# Patient Record
Sex: Female | Born: 1990 | Hispanic: Yes | Marital: Married | Smoking: Never smoker
Health system: Southern US, Community
[De-identification: ages and names within clinical notes are randomized; demographics above are authoritative.]

## PROBLEM LIST (undated history)

## (undated) DIAGNOSIS — Z789 Other specified health status: Secondary | ICD-10-CM

## (undated) HISTORY — PX: TUBAL LIGATION: SHX77

---
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
## 2010-11-26 ENCOUNTER — Ambulatory Visit: Payer: Self-pay | Admitting: Family Medicine

## 2014-06-11 ENCOUNTER — Emergency Department: Payer: Self-pay | Admitting: Emergency Medicine

## 2014-06-19 ENCOUNTER — Emergency Department: Payer: Self-pay | Admitting: Emergency Medicine

## 2015-11-07 ENCOUNTER — Other Ambulatory Visit: Payer: Self-pay | Admitting: Family Medicine

## 2015-11-07 DIAGNOSIS — Z369 Encounter for antenatal screening, unspecified: Secondary | ICD-10-CM

## 2015-11-08 LAB — OB RESULTS CONSOLE HIV ANTIBODY (ROUTINE TESTING): HIV: NONREACTIVE

## 2015-11-08 LAB — OB RESULTS CONSOLE ABO/RH: RH Type: POSITIVE

## 2015-11-08 LAB — OB RESULTS CONSOLE HEPATITIS B SURFACE ANTIGEN: HEP B S AG: NEGATIVE

## 2015-11-08 LAB — OB RESULTS CONSOLE RPR: RPR: NONREACTIVE

## 2015-11-08 LAB — OB RESULTS CONSOLE ANTIBODY SCREEN: ANTIBODY SCREEN: NEGATIVE

## 2015-11-29 ENCOUNTER — Ambulatory Visit
Admission: RE | Admit: 2015-11-29 | Discharge: 2015-11-29 | Disposition: A | Discharge status: Home / Self Care | Payer: Self-pay | Source: Ambulatory Visit | Attending: Maternal & Fetal Medicine | Admitting: Maternal & Fetal Medicine

## 2015-11-29 ENCOUNTER — Ambulatory Visit (HOSPITAL_BASED_OUTPATIENT_CLINIC_OR_DEPARTMENT_OTHER)
Admission: RE | Admit: 2015-11-29 | Discharge: 2015-11-29 | Disposition: A | Discharge status: Home / Self Care | Payer: Medicaid Other | Source: Ambulatory Visit | Attending: Maternal & Fetal Medicine | Admitting: Maternal & Fetal Medicine

## 2015-11-29 VITALS — BP 135/74 | HR 85 | Temp 98.3°F | Resp 18 | Ht 62.4 in | Wt 174.2 lb

## 2015-11-29 DIAGNOSIS — Z36 Encounter for antenatal screening of mother: Secondary | ICD-10-CM | POA: Diagnosis not present

## 2015-11-29 DIAGNOSIS — Z3A11 11 weeks gestation of pregnancy: Secondary | ICD-10-CM | POA: Insufficient documentation

## 2015-11-29 DIAGNOSIS — N83291 Other ovarian cyst, right side: Secondary | ICD-10-CM | POA: Insufficient documentation

## 2015-11-29 DIAGNOSIS — O321XX0 Maternal care for breech presentation, not applicable or unspecified: Secondary | ICD-10-CM | POA: Insufficient documentation

## 2015-11-29 DIAGNOSIS — N83292 Other ovarian cyst, left side: Secondary | ICD-10-CM | POA: Insufficient documentation

## 2015-11-29 DIAGNOSIS — Z369 Encounter for antenatal screening, unspecified: Secondary | ICD-10-CM | POA: Insufficient documentation

## 2015-11-29 DIAGNOSIS — Z3482 Encounter for supervision of other normal pregnancy, second trimester: Secondary | ICD-10-CM

## 2015-11-29 DIAGNOSIS — Z3481 Encounter for supervision of other normal pregnancy, first trimester: Secondary | ICD-10-CM | POA: Insufficient documentation

## 2015-11-29 HISTORY — DX: Other specified health status: Z78.9

## 2015-11-29 NOTE — Progress Notes (Signed)
 Referring physician:  [facility] Length of Consultation: 40 minutes   [name]  was referred to [facility] for genetic counseling to review prenatal screening and testing options.  This note summarizes the information we discussed.    We offered the following routine screening tests for this pregnancy:  First trimester screening, which includes nuchal translucency ultrasound screen and first trimester maternal serum marker screening.  The nuchal translucency has approximately an 80% detection rate for Down syndrome and can be positive for other chromosome abnormalities as well as congenital heart defects.  When combined with a maternal serum marker screening, the detection rate is up to 90% for Down syndrome and up to 97% for trisomy 18.     Maternal serum marker screening, a blood test that measures pregnancy proteins, can provide risk assessments for Down syndrome, trisomy 18, and open neural tube defects (spina bifida, anencephaly). Because it does not directly examine the fetus, it cannot positively diagnose or rule out these problems.  Targeted ultrasound uses high frequency sound waves to create an image of the developing fetus.  An ultrasound is often recommended as a routine means of evaluating the pregnancy.  It is also used to screen for fetal anatomy problems (for example, a heart defect) that might be suggestive of a chromosomal or other abnormality.   Should these screening tests indicate an increased concern, then the following additional testing options would be offered:  The chorionic villus sampling procedure is available for first trimester chromosome analysis.  This involves the withdrawal of a small amount of chorionic villi (tissue from the developing placenta).  Risk of pregnancy loss is estimated to be approximately 1 in 200 to 1 in 100 (0.5 to 1%).  There is approximately a 1% (1 in 100) chance that the CVS chromosome results will  be unclear.  Chorionic villi cannot be tested for neural tube defects.     Amniocentesis involves the removal of a small amount of amniotic fluid from the sac surrounding the fetus with the use of a thin needle inserted through the maternal abdomen and uterus.  Ultrasound guidance is used throughout the procedure.  Fetal cells from amniotic fluid are directly evaluated and > 99.5% of chromosome problems and > 98% of open neural tube defects can be detected. This procedure is generally performed after the 15th week of pregnancy.  The main risks to this procedure include complications leading to miscarriage in less than 1 in 200 cases (0.5%).  As another option for information if the pregnancy is suspected to be an an increased chance for certain chromosome conditions, we also reviewed the availability of cell free fetal DNA testing from maternal blood to determine whether or not the baby may have either Down syndrome, trisomy 13, or trisomy 18.  This test utilizes a maternal blood sample and DNA sequencing technology to isolate circulating cell free fetal DNA from maternal plasma.  The fetal DNA can then be analyzed for DNA sequences that are derived from the three most common chromosomes involved in aneuploidy, chromosomes 13, 18, and 21.  If the overall amount of DNA is greater than the expected level for any of these chromosomes, aneuploidy is suspected.  While we do not consider it a replacement for invasive testing and karyotype analysis, a negative result from this testing would be reassuring, though not a guarantee of a normal chromosome complement for the baby.  An abnormal result is certainly suggestive of an abnormal chromosome complement,   though we would still recommend CVS or amniocentesis to confirm any findings from this testing.   Cystic Fibrosis and Spinal Muscular Atrophy (SMA) screening were also discussed with the patient. Both conditions are recessive, which means that both parents must be  carriers in order to have a child with the disease.  Cystic fibrosis (CF) is one of the most common genetic conditions in persons of Caucasian ancestry.  This condition occurs in approximately 1 in 2,500 Caucasian persons and results in thickened secretions in the lungs, digestive, and reproductive systems.  For a baby to be at risk for having CF, both of the parents must be carriers for this condition.  Approximately 1 in 7 Caucasian persons is a carrier for CF.  Current carrier testing looks for the most common mutations in the gene for CF and can detect approximately 90% of carriers in the Caucasian population.  This means that the carrier screening can greatly reduce, but cannot eliminate, the chance for an individual to have a child with CF.  If an individual is found to be a carrier for CF, then carrier testing would be available for the partner. As part of [place] newborn screening profile, all babies born in the state of [place] will have a two-tier screening process.  Specimens are first tested to determine the concentration of immunoreactive trypsinogen (IRT).  The top 5% of specimens with the highest IRT values then undergo DNA testing using a panel of over 40 common CF mutations. SMA is a neurodegenerative disorder that leads to atrophy of skeletal muscle and overall weakness.  This condition is also more prevalent in the Caucasian population, with 1 in 40-1 in 60 persons being a carrier and 1 in 6,000-1 in 10,000 children being affected.  There are multiple forms of the disease, with some causing death in infancy to other forms with survival into adulthood.  The genetics of SMA is complex, but carrier screening can detect up to 95% of carriers in the Caucasian population.  Similar to CF, a negative result can greatly reduce, but cannot eliminate, the chance to have a child with SMA.  We obtained a detailed family history and pregnancy history.  Alexandra Thompson and her partner reported that  his mother, maternal aunt and maternal grandmother have all had breast cancer.  The grandmother was in her 101s at the time of diagnosis, but the other two were 90-42 years old.  He stated that family met with someone and had testing that was "negative". If they had genetic testing for cancer genetic syndromes, we are happy to review those medical records to help answer any questions he may.  He also reported one paternal first cousin once removed who passed away at birth, but the cause is not known.  There may be many reasons for infant death including some which are genetic and others that are not.  Without additional medical information, we cannot assess the chance for a similar condition in other family members.  We are happy to discuss this further if more information becomes available. The remainder of the family history was reported to be unremarkable for birth defects, mental retardation, recurrent pregnancy loss or known chromosome abnormalities.  Alexandra Thompson stated that this is her fifth pregnancy.  She has two healthy children and one miscarriage with her first partner and had one early miscarriage with her current partner.  He has two healthy children as well.  She reported no complications or exposures that would be expected to  increase the risk for birth defects.  Alexandra Thompson had one episode of drinking alcohol prior to learning that she was pregnant.  This was estimated to have been at 4 weeks, during the all or none period, and would not be expected to be concerning.  After consideration of the options, Alexandra Thompson elected to proceed with an ultrasound only and to decline first trimester screening as well as CF and SMA carrier testing.  An ultrasound was performed at the time of the visit.  The gestational age was consistent with  11 weeks.  Fetal anatomy could not be assessed due to early gestational age.  Please refer to the ultrasound report for details of that study.  Alexandra Thompson was  encouraged to call with questions or concerns.  We can be contacted at [phone].    Deborah F. Wells, MS, CGC   

## 2015-12-03 NOTE — Progress Notes (Signed)
Agree with assessment and plan as outlined in CGC Wells's note.   

## 2016-01-06 ENCOUNTER — Emergency Department
Admission: EM | Admit: 2016-01-06 | Discharge: 2016-01-06 | Disposition: A | Discharge status: Home / Self Care | Payer: Medicaid Other | Attending: Emergency Medicine | Admitting: Emergency Medicine

## 2016-01-06 ENCOUNTER — Encounter: Payer: Self-pay | Admitting: Emergency Medicine

## 2016-01-06 DIAGNOSIS — Z3A16 16 weeks gestation of pregnancy: Secondary | ICD-10-CM | POA: Insufficient documentation

## 2016-01-06 DIAGNOSIS — O26892 Other specified pregnancy related conditions, second trimester: Secondary | ICD-10-CM | POA: Insufficient documentation

## 2016-01-06 DIAGNOSIS — R1012 Left upper quadrant pain: Secondary | ICD-10-CM

## 2016-01-06 LAB — URINALYSIS COMPLETE WITH MICROSCOPIC (ARMC ONLY)
Bilirubin Urine: NEGATIVE
Glucose, UA: NEGATIVE mg/dL
HGB URINE DIPSTICK: NEGATIVE
Ketones, ur: NEGATIVE mg/dL
Nitrite: NEGATIVE
PROTEIN: NEGATIVE mg/dL
SPECIFIC GRAVITY, URINE: 1.013 (ref 1.005–1.030)
pH: 8 (ref 5.0–8.0)

## 2016-01-06 LAB — COMPREHENSIVE METABOLIC PANEL
ALT: 18 U/L (ref 14–54)
AST: 15 U/L (ref 15–41)
Albumin: 3.2 g/dL — ABNORMAL LOW (ref 3.5–5.0)
Alkaline Phosphatase: 76 U/L (ref 38–126)
Anion gap: 6 (ref 5–15)
BUN: 9 mg/dL (ref 6–20)
CHLORIDE: 109 mmol/L (ref 101–111)
CO2: 22 mmol/L (ref 22–32)
Calcium: 9.2 mg/dL (ref 8.9–10.3)
Creatinine, Ser: 0.56 mg/dL (ref 0.44–1.00)
GFR calc Af Amer: >60 mL/min (ref >60)
Glucose, Bld: 91 mg/dL (ref 65–99)
POTASSIUM: 3.6 mmol/L (ref 3.5–5.1)
Sodium: 137 mmol/L (ref 135–145)
Total Bilirubin: 0.2 mg/dL — ABNORMAL LOW (ref 0.3–1.2)
Total Protein: 7.1 g/dL (ref 6.5–8.1)

## 2016-01-06 LAB — CBC
HEMATOCRIT: 36.9 % (ref 35.0–47.0)
HEMOGLOBIN: 12.9 g/dL (ref 12.0–16.0)
MCH: 30 pg (ref 26.0–34.0)
MCHC: 34.8 g/dL (ref 32.0–36.0)
MCV: 86.1 fL (ref 80.0–100.0)
Platelets: 183 10*3/uL (ref 150–440)
RBC: 4.29 MIL/uL (ref 3.80–5.20)
RDW: 13.9 % (ref 11.5–14.5)
WBC: 12.7 10*3/uL — AB (ref 3.6–11.0)

## 2016-01-06 LAB — LIPASE, BLOOD: LIPASE: 22 U/L (ref 11–51)

## 2016-01-06 NOTE — ED Triage Notes (Signed)
Pt to ED from home c/o left side pain since 2000 tonight when had sudden cramping pain after standing up.  Denies radiating, denies n/v/d, denies SOB.  Pt is [redacted] weeks pregnant.  States urinary odor and discharge and itching recently, denies pain or frequency.  Pt presents A&Ox4, speaking in complete ane coherent sentences, chest rise even and unlabored.

## 2016-01-06 NOTE — Discharge Instructions (Signed)
Return to the emergency department especially for fever, vaginal bleeding, right-sided lower or upper abdominal pain, or any other new concerns. Please take over-the-counter Tylenol around-the-clock for pain. Contact your OB/GYN for further evaluation Over-the-counter Maalox or Mylanta for indigestion and epigastric pain  Please return immediately if condition worsens. Please contact her primary physician or the physician you were given for referral. If you have any specialist physicians involved in her treatment and plan please also contact them. Thank you for using [facility].

## 2016-01-06 NOTE — ED Provider Notes (Signed)
Time Seen: Approximately 0324  I have reviewed the triage notes  Chief Complaint: Abdominal Pain   History of Present Illness: Alexandra Thompson is a 25 y.o. female who is currently gravida 5 para 2 at [redacted] weeks pregnant. She states she's been having intermittent left upper quadrant cramping which is occurred during her pregnancy over the last 2 weeks. She states it seemed to be accelerating over the last 24 hours. She has had some vaginal discharge and apparently talked to her primary OB/GYN with the health department and had testing done which was negative. Patient describes some unusual urinary odor but no burning with urination or urinary frequency. She's had some mild epigastric discomfort without back or flank discomfort. She denies any fever at home ,Normal fetal movements   Past Medical History:  Diagnosis Date  . Medical history non-contributory     Patient Active Problem List   Diagnosis Date Noted  . First trimester screening 11/29/2015    Past Surgical History:  Procedure Laterality Date  . NO PAST SURGERIES      Past Surgical History:  Procedure Laterality Date  . NO PAST SURGERIES      Current Outpatient Rx  . Order #: [id number] Class: Historical Med    Allergies:  Review of patient's allergies indicates no known allergies.  Family History: History reviewed. No pertinent family history.  Social History: Social History  Substance Use Topics  . Smoking status: Never Smoker  . Smokeless tobacco: Never Used  . Alcohol use No     Review of Systems:   10 point review of systems was performed and was otherwise negative:  Constitutional: No fever Eyes: No visual disturbances ENT: No sore throat, ear pain Cardiac: No chest pain Respiratory: No shortness of breath, wheezing, or stridor Abdomen: Patient denies any right-sided abdominal pain, nausea, vomiting, loose stool or diarrhea, melena or hematochezia Endocrine: No weight loss, No night  sweats Extremities: No peripheral edema, cyanosis Skin: No rashes, easy bruising Neurologic: No focal weakness, trouble with speech or swollowing Urologic: No dysuria, Hematuria, or urinary frequency   Physical Exam:  ED Triage Vitals  Enc Vitals Group     BP 01/06/16 0146 127/68     Pulse Rate 01/06/16 0146 91     Resp 01/06/16 0146 18     Temp 01/06/16 0146 98.1 F (36.7 C)     Temp Source 01/06/16 0146 Oral     SpO2 01/06/16 0146 100 %     Weight 01/06/16 0148 172 lb (78 kg)     Height 01/06/16 0148 4\' 9"  (1.448 m)     Head Circumference --      Peak Flow --      Pain Score 01/06/16 0153 6     Pain Loc --      Pain Edu? --      Excl. in GC? --     General: Awake , Alert , and Oriented times 3; GCS 15 Head: Normal cephalic , atraumatic Eyes: Pupils equal , round, reactive to light Nose/Throat: No nasal drainage, patent upper airway without erythema or exudate.  Neck: Supple, Full range of motion, No anterior adenopathy or palpable thyroid masses Lungs: Clear to ascultation without wheezes , rhonchi, or rales Heart: Regular rate, regular rhythm without murmurs , gallops , or rubs Abdomen: Soft, non tender without rebound, guarding , or rigidity; bowel sounds positive and symmetric in all 4 quadrants. No organomegaly .      Leopold maneuvers show approximately 5 cm  above the pelvic brim  Extremities: 2 plus symmetric pulses. No edema, clubbing or cyanosis Neurologic: normal ambulation, Motor symmetric without deficits, sensory intact Skin: warm, dry, no rashes   Labs:   All laboratory work was reviewed including any pertinent negatives or positives listed below:  Labs Reviewed  COMPREHENSIVE METABOLIC PANEL - Abnormal; Notable for the following:       Result Value   Albumin 3.2 (*)    Total Bilirubin 0.2 (*)    All other components within normal limits  CBC - Abnormal; Notable for the following:    WBC 12.7 (*)    All other components within normal limits   URINALYSIS COMPLETEWITH MICROSCOPIC ([facility] ONLY) - Abnormal; Notable for the following:    Color, Urine YELLOW (*)    APPearance HAZY (*)    Leukocytes, UA 1+ (*)    Bacteria, UA RARE (*)    Squamous Epithelial / LPF 0-5 (*)    All other components within normal limits  LIPASE, BLOOD  Laboratory work was reviewed and showed no clinically significant abnormalities.    ED Course: Patient's stay here was uneventful and she's had a previous ultrasound as an outpatient she describes is normal. I'm not sure of the nature of her abdominal and flank discomfort at this time as her urine appears normal and she has no indications of miscarriage etc. I felt her pain was was unlikely from a surgical source. Clinical Course     Assessment: * Left upper quadrant abdominal pain Second trimester pregnancy   Final Clinical Impression: *  Final diagnoses:  Left upper quadrant pain     Plan: * Outpatient Patient was advised to return immediately if condition worsens. Patient was advised to follow up with their primary care physician or other specialized physicians involved in their outpatient care. The patient and/or family member/power of attorney had laboratory results reviewed at the bedside. All questions and concerns were addressed and appropriate discharge instructions were distributed by the nursing staff. Patient was recommended to continue with over-the-counter Tylenol for pain. She was also advised take Maalox or Mylanta for any epigastric or reflux-type symptoms.            Jennye MoccasinBrian S Maritssa Haughton, MD 01/06/16 [phone]

## 2016-01-17 ENCOUNTER — Ambulatory Visit
Admission: RE | Admit: 2016-01-17 | Discharge: 2016-01-17 | Disposition: A | Discharge status: Home / Self Care | Payer: Medicaid Other | Source: Ambulatory Visit | Attending: Obstetrics & Gynecology | Admitting: Obstetrics & Gynecology

## 2016-01-17 DIAGNOSIS — Z3482 Encounter for supervision of other normal pregnancy, second trimester: Secondary | ICD-10-CM

## 2016-01-17 DIAGNOSIS — Z3A18 18 weeks gestation of pregnancy: Secondary | ICD-10-CM | POA: Insufficient documentation

## 2016-01-17 DIAGNOSIS — Z369 Encounter for antenatal screening, unspecified: Secondary | ICD-10-CM

## 2016-03-31 ENCOUNTER — Other Ambulatory Visit (HOSPITAL_COMMUNITY): Payer: Self-pay | Admitting: Family Medicine

## 2016-03-31 DIAGNOSIS — Z3483 Encounter for supervision of other normal pregnancy, third trimester: Secondary | ICD-10-CM

## 2016-04-03 ENCOUNTER — Ambulatory Visit
Admission: RE | Admit: 2016-04-03 | Discharge: 2016-04-03 | Disposition: A | Discharge status: Home / Self Care | Payer: Medicaid Other | Source: Ambulatory Visit | Attending: Family Medicine | Admitting: Family Medicine

## 2016-04-03 DIAGNOSIS — Z3A29 29 weeks gestation of pregnancy: Secondary | ICD-10-CM | POA: Insufficient documentation

## 2016-04-03 DIAGNOSIS — Z3483 Encounter for supervision of other normal pregnancy, third trimester: Secondary | ICD-10-CM | POA: Diagnosis present

## 2016-05-16 ENCOUNTER — Other Ambulatory Visit: Payer: Self-pay | Admitting: Advanced Practice Midwife

## 2016-05-16 DIAGNOSIS — Z3689 Encounter for other specified antenatal screening: Secondary | ICD-10-CM

## 2016-05-16 LAB — OB RESULTS CONSOLE GBS: GBS: POSITIVE

## 2016-05-17 LAB — OB RESULTS CONSOLE GC/CHLAMYDIA
Chlamydia: NEGATIVE
Gonorrhea: NEGATIVE

## 2016-05-23 ENCOUNTER — Other Ambulatory Visit: Payer: Self-pay | Admitting: Obstetrics and Gynecology

## 2016-05-23 ENCOUNTER — Other Ambulatory Visit: Payer: Self-pay

## 2016-05-23 MED ORDER — DINOPROSTONE 10 MG VA INST
10.0000 mg | VAGINAL_INSERT | Freq: Once | VAGINAL | 0 refills | Status: AC
Start: 2016-05-23 — End: 2016-05-23

## 2016-05-29 ENCOUNTER — Other Ambulatory Visit: Payer: Self-pay | Admitting: *Deleted

## 2016-05-29 DIAGNOSIS — O3663X0 Maternal care for excessive fetal growth, third trimester, not applicable or unspecified: Secondary | ICD-10-CM

## 2016-06-02 ENCOUNTER — Ambulatory Visit
Admission: RE | Admit: 2016-06-02 | Discharge: 2016-06-02 | Disposition: A | Discharge status: Home / Self Care | Payer: Medicaid Other | Source: Ambulatory Visit | Attending: Maternal & Fetal Medicine | Admitting: Maternal & Fetal Medicine

## 2016-06-02 DIAGNOSIS — N83202 Unspecified ovarian cyst, left side: Secondary | ICD-10-CM | POA: Diagnosis not present

## 2016-06-02 DIAGNOSIS — O3483 Maternal care for other abnormalities of pelvic organs, third trimester: Secondary | ICD-10-CM | POA: Diagnosis not present

## 2016-06-02 DIAGNOSIS — Z3689 Encounter for other specified antenatal screening: Secondary | ICD-10-CM | POA: Insufficient documentation

## 2016-06-02 DIAGNOSIS — Z3A38 38 weeks gestation of pregnancy: Secondary | ICD-10-CM | POA: Insufficient documentation

## 2016-06-02 DIAGNOSIS — O3663X0 Maternal care for excessive fetal growth, third trimester, not applicable or unspecified: Secondary | ICD-10-CM

## 2016-06-04 NOTE — H&P (Signed)
Alexandra Thompson is a 26 y.o. female here for Follow-up .pt is a G4P2 with [facility] 06/14/16 based on 11+5 wk u/s  Returns to further discuss her u/s that was done  On 06/02/16 that showed EFW 4020 gm ( 94%) and fetal BPD 99%  Pt with a prior delivery complicated by shoulder dystocia ( baby weighed 4791 gm )  She also has 2 complex left ovarian cysts 10x7x10 cm and 5x2 x3 cm possible endometriomas . She is scheduled for a BTL and papers have been signed .  After further discussion pt has decided on a Primary LTCS and BTL and left ovarian cystectomies    Past Medical History:  has no past medical history on file.  Past Surgical History:  has a past surgical history that includes Dilation and curettage of uterus. Family History: family history is not on file. Social History:  reports that she has never smoked. She has never used smokeless tobacco. She reports that she does not drink alcohol. OB/GYN History:          OB History    Gravida Para Term Preterm AB Living   4 2 2   1 2    SAB TAB Ectopic Molar Multiple Live Births   1         2      Allergies: has No Known Allergies. Medications:  Current Outpatient Prescriptions:  .  prenatal vitamin, no iron, FA-Ca-B vitamins (TRIMESIS, FOLBECAL) ER tablet, Take 1 tablet by mouth once daily., Disp: , Rfl:   Review of Systems: General:                      No fatigue or weight loss Eyes:                           No vision changes Ears:                            No hearing difficulty Respiratory:                No cough or shortness of breath Pulmonary:                  No asthma or shortness of breath Cardiovascular:           No chest pain, palpitations, dyspnea on exertion Gastrointestinal:          No abdominal bloating, chronic diarrhea, constipations, masses, pain or hematochezia Genitourinary:             No hematuria, dysuria, abnormal vaginal discharge, pelvic pain, Menometrorrhagia Lymphatic:                   No swollen lymph  nodes Musculoskeletal:         No muscle weakness Neurologic:                  No extremity weakness, syncope, seizure disorder Psychiatric:                  No history of depression, delusions or suicidal/homicidal ideation    Exam:      Vitals:   06/03/16 1600  BP: 122/80  Pulse: 76    Body mass index is 45.88 kg/m.  WDWN hispanic  female in NAD   Lungs: CTA  CV : RRR without murmur    Neck:  no thyromegaly Abdomen:  soft , no mass, normal active bowel sounds,  non-tender, no rebound tenderness, gravid  Pelvic: tanner stage 5 ,  External genitalia: vulva /labia no lesions Urethra: no prolapse Vagina: normal physiologic d/c Cervix: no lesions, no cervical motion tenderness   Uterus: normal size shape and contour, non-tender Adnexa: no mass,  non-tender     Impression:   The primary encounter diagnosis was Large for gestational age fetus affecting management of mother, antepartum, third trimester, fetus 1. Diagnoses of History of macrosomia in infant in prior pregnancy, currently pregnant and Cyst of ovary, left were also pertinent to this visit.    Plan:   Primary LTCS and BTL and left ovarian cystectomies     Risks of the procedure discussed with pt including potential for oophorectomy . Failure rate from BTL 1/200/ yr   Vilma PraderHOMAS JANSE SCHERMERHORN, MD

## 2016-06-06 ENCOUNTER — Encounter
Admission: RE | Admit: 2016-06-06 | Discharge: 2016-06-06 | Disposition: A | Discharge status: Home / Self Care | Payer: Medicaid Other | Source: Ambulatory Visit | Attending: Obstetrics and Gynecology | Admitting: Obstetrics and Gynecology

## 2016-06-06 DIAGNOSIS — Z01812 Encounter for preprocedural laboratory examination: Secondary | ICD-10-CM | POA: Diagnosis present

## 2016-06-06 LAB — TYPE AND SCREEN
ABO/RH(D): A POS
Antibody Screen: NEGATIVE
Extend sample reason: UNDETERMINED

## 2016-06-06 LAB — RAPID HIV SCREEN (HIV 1/2 AB+AG)
HIV 1/2 ANTIBODIES: NONREACTIVE
HIV-1 P24 ANTIGEN - HIV24: NONREACTIVE

## 2016-06-06 NOTE — Patient Instructions (Signed)
Your procedure is scheduled on: Monday 06/09/16 Report to [facility] AT 7:00 AM (ENTER THRU ER OR VISITOR ENTRANCE) .  Remember: Instructions that are not followed completely may result in serious medical risk, up to and including death, or upon the discretion of your surgeon and anesthesiologist your surgery may need to be rescheduled.    __X__ 1. Do not eat food or drink liquids after midnight. No gum chewing or hard candies.     __X__ 2. No Alcohol for 24 hours before or after surgery.   ____ 3. Bring all medications with you on the day of surgery if instructed.    __X__ 4. Notify your doctor if there is any change in your medical condition     (cold, fever, infections).             ___X__5. No smoking within 24 hours of your surgery.     Do not wear jewelry, make-up, hairpins, clips or nail polish.  Do not wear lotions, powders, or perfumes.   Do not shave 48 hours prior to surgery. Men may shave face and neck.  Do not bring valuables to the hospital.    [facility] is not [facility] for any belongings or valuables.               Contacts, dentures or bridgework may not be worn into surgery.  Leave your suitcase in the car. After surgery it may be brought to your room.  For patients admitted to the hospital, discharge time is determined by your                treatment team.   Patients discharged the day of surgery will not be allowed to drive home.   Please read over the following fact sheets that you were given:   MRSA Information   ____ Take these medicines the morning of surgery with A SIP OF WATER:    1. NONE  2.   3.   4.  5.  6.  ____ Fleet Enema (as directed)   __X__ Use CHG Soap as directed  ____ Use inhalers on the day of surgery  ____ Stop metformin 2 days prior to surgery    ____ Take 1/2 of usual insulin dose the night before surgery and none on the morning of surgery.   ____ Stop Coumadin/Plavix/aspirin on   __X__ Stop Anti-inflammatories such  as Advil, Aleve, Ibuprofen, Motrin, Naproxen, Naprosyn, Goodies,powder, or aspirin products.  OK to take Tylenol.   ____ Stop supplements until after surgery.    ____ Bring C-Pap to the hospital.

## 2016-06-07 LAB — RPR: RPR: NONREACTIVE

## 2016-06-09 ENCOUNTER — Inpatient Hospital Stay: Payer: Medicaid Other | Admitting: Anesthesiology

## 2016-06-09 ENCOUNTER — Encounter
Admission: RE | Disposition: A | Discharge status: Home / Self Care | Payer: Self-pay | Source: Ambulatory Visit | Attending: Obstetrics and Gynecology

## 2016-06-09 ENCOUNTER — Encounter: Payer: Self-pay | Admitting: *Deleted

## 2016-06-09 ENCOUNTER — Inpatient Hospital Stay
Admission: RE | Admit: 2016-06-09 | Discharge: 2016-06-12 | DRG: 765 | Disposition: A | Discharge status: Home / Self Care | Payer: Medicaid Other | Source: Ambulatory Visit | Attending: Obstetrics and Gynecology | Admitting: Obstetrics and Gynecology

## 2016-06-09 DIAGNOSIS — O99824 Streptococcus B carrier state complicating childbirth: Secondary | ICD-10-CM | POA: Diagnosis present

## 2016-06-09 DIAGNOSIS — D62 Acute posthemorrhagic anemia: Secondary | ICD-10-CM | POA: Diagnosis not present

## 2016-06-09 DIAGNOSIS — O9081 Anemia of the puerperium: Secondary | ICD-10-CM | POA: Diagnosis not present

## 2016-06-09 DIAGNOSIS — N83202 Unspecified ovarian cyst, left side: Secondary | ICD-10-CM | POA: Diagnosis present

## 2016-06-09 DIAGNOSIS — O99214 Obesity complicating childbirth: Secondary | ICD-10-CM | POA: Diagnosis present

## 2016-06-09 DIAGNOSIS — O3660X0 Maternal care for excessive fetal growth, unspecified trimester, not applicable or unspecified: Secondary | ICD-10-CM | POA: Diagnosis present

## 2016-06-09 DIAGNOSIS — Z6841 Body Mass Index (BMI) 40.0 and over, adult: Secondary | ICD-10-CM | POA: Diagnosis not present

## 2016-06-09 DIAGNOSIS — O3663X0 Maternal care for excessive fetal growth, third trimester, not applicable or unspecified: Secondary | ICD-10-CM | POA: Diagnosis present

## 2016-06-09 DIAGNOSIS — Z302 Encounter for sterilization: Secondary | ICD-10-CM | POA: Diagnosis not present

## 2016-06-09 DIAGNOSIS — O3483 Maternal care for other abnormalities of pelvic organs, third trimester: Secondary | ICD-10-CM | POA: Diagnosis present

## 2016-06-09 DIAGNOSIS — Z9889 Other specified postprocedural states: Secondary | ICD-10-CM

## 2016-06-09 HISTORY — PX: OOPHORECTOMY: SHX6387

## 2016-06-09 LAB — CBC
HEMATOCRIT: 33.1 % — AB (ref 35.0–47.0)
Hemoglobin: 11.4 g/dL — ABNORMAL LOW (ref 12.0–16.0)
MCH: 30.2 pg (ref 26.0–34.0)
MCHC: 34.6 g/dL (ref 32.0–36.0)
MCV: 87.4 fL (ref 80.0–100.0)
PLATELETS: 160 10*3/uL (ref 150–440)
RBC: 3.79 MIL/uL — AB (ref 3.80–5.20)
RDW: 15.1 % — ABNORMAL HIGH (ref 11.5–14.5)
WBC: 14.7 10*3/uL — AB (ref 3.6–11.0)

## 2016-06-09 LAB — CBC WITH DIFFERENTIAL/PLATELET
BASOS PCT: 0 %
Basophils Absolute: 0 10*3/uL (ref 0–0.1)
Eosinophils Absolute: 0.1 10*3/uL (ref 0–0.7)
Eosinophils Relative: 1 %
HEMATOCRIT: 37.2 % (ref 35.0–47.0)
HEMOGLOBIN: 12.9 g/dL (ref 12.0–16.0)
LYMPHS ABS: 2 10*3/uL (ref 1.0–3.6)
LYMPHS PCT: 18 %
MCH: 30.2 pg (ref 26.0–34.0)
MCHC: 34.8 g/dL (ref 32.0–36.0)
MCV: 86.9 fL (ref 80.0–100.0)
Monocytes Absolute: 0.6 10*3/uL (ref 0.2–0.9)
Monocytes Relative: 6 %
NEUTROS ABS: 8.3 10*3/uL — AB (ref 1.4–6.5)
NEUTROS PCT: 75 %
Platelets: 169 10*3/uL (ref 150–440)
RBC: 4.28 MIL/uL (ref 3.80–5.20)
RDW: 14.9 % — ABNORMAL HIGH (ref 11.5–14.5)
WBC: 11 10*3/uL (ref 3.6–11.0)

## 2016-06-09 LAB — ABO/RH: ABO/RH(D): A POS

## 2016-06-09 SURGERY — Surgical Case
Anesthesia: Spinal | Site: Abdomen | Laterality: Left | Wound class: Clean

## 2016-06-09 MED ORDER — CEFAZOLIN SODIUM-DEXTROSE 2-4 GM/100ML-% IV SOLN: 2.0000 g | INTRAVENOUS | Status: DC

## 2016-06-09 MED ORDER — PHENYLEPHRINE HCL 10 MG/ML IJ SOLN
INTRAMUSCULAR | Status: DC | PRN
  Administered 2016-06-09 (×2): 100 ug via INTRAVENOUS
  Administered 2016-06-09: 50 ug via INTRAVENOUS

## 2016-06-09 MED ORDER — FENTANYL CITRATE (PF) 100 MCG/2ML IJ SOLN
INTRAMUSCULAR | Status: AC
  Filled 2016-06-09: qty 2

## 2016-06-09 MED ORDER — OXYCODONE HCL 5 MG/5ML PO SOLN
5.0000 mg | Freq: Once | ORAL | Status: DC | PRN
  Filled 2016-06-09: qty 5

## 2016-06-09 MED ORDER — FENTANYL CITRATE (PF) 100 MCG/2ML IJ SOLN
INTRAMUSCULAR | Status: DC | PRN
  Administered 2016-06-09: 25 ug via INTRAVENOUS
  Administered 2016-06-09: 15 ug via INTRATHECAL
  Administered 2016-06-09: 25 ug via INTRAVENOUS

## 2016-06-09 MED ORDER — BUPIVACAINE IN DEXTROSE 0.75-8.25 % IT SOLN
INTRATHECAL | Status: DC | PRN
Start: 2016-06-09 — End: 2016-06-09
  Administered 2016-06-09: 1.6 mL via INTRATHECAL

## 2016-06-09 MED ORDER — OXYCODONE-ACETAMINOPHEN 5-325 MG PO TABS
1.0000 | ORAL_TABLET | ORAL | Status: DC | PRN
  Administered 2016-06-10 – 2016-06-12 (×6): 1 via ORAL
  Filled 2016-06-09 (×5): qty 1

## 2016-06-09 MED ORDER — OXYTOCIN 40 UNITS IN LACTATED RINGERS INFUSION - SIMPLE MED
INTRAVENOUS | Status: AC
  Filled 2016-06-09: qty 1000

## 2016-06-09 MED ORDER — ACETAMINOPHEN 500 MG PO TABS
1000.0000 mg | ORAL_TABLET | Freq: Four times a day (QID) | ORAL | Status: DC
  Administered 2016-06-09: 1000 mg via ORAL
  Filled 2016-06-09: qty 2

## 2016-06-09 MED ORDER — NALOXONE HCL 2 MG/2ML IJ SOSY
1.0000 ug/kg/h | PREFILLED_SYRINGE | INTRAVENOUS | Status: DC | PRN
  Filled 2016-06-09: qty 2

## 2016-06-09 MED ORDER — OXYCODONE HCL 5 MG PO TABS: 5.0000 mg | ORAL_TABLET | Freq: Once | ORAL | Status: DC | PRN

## 2016-06-09 MED ORDER — ONDANSETRON HCL 4 MG/2ML IJ SOLN
INTRAMUSCULAR | Status: DC | PRN
  Administered 2016-06-09: 4 mg via INTRAVENOUS

## 2016-06-09 MED ORDER — SOD CITRATE-CITRIC ACID 500-334 MG/5ML PO SOLN: 30.0000 mL | ORAL | Status: DC

## 2016-06-09 MED ORDER — SENNOSIDES-DOCUSATE SODIUM 8.6-50 MG PO TABS
2.0000 | ORAL_TABLET | ORAL | Status: DC
  Administered 2016-06-10 – 2016-06-12 (×2): 2 via ORAL
  Filled 2016-06-09 (×2): qty 2

## 2016-06-09 MED ORDER — DIPHENHYDRAMINE HCL 25 MG PO CAPS: 25.0000 mg | ORAL_CAPSULE | Freq: Four times a day (QID) | ORAL | Status: DC | PRN

## 2016-06-09 MED ORDER — LIDOCAINE HCL (PF) 2 % IJ SOLN
INTRAMUSCULAR | Status: AC
  Filled 2016-06-09: qty 2

## 2016-06-09 MED ORDER — MENTHOL 3 MG MT LOZG
1.0000 | LOZENGE | OROMUCOSAL | Status: DC | PRN
  Filled 2016-06-09: qty 9

## 2016-06-09 MED ORDER — OXYTOCIN 10 UNIT/ML IJ SOLN
INTRAMUSCULAR | Status: AC
  Filled 2016-06-09: qty 4

## 2016-06-09 MED ORDER — ONDANSETRON HCL 4 MG/2ML IJ SOLN
INTRAMUSCULAR | Status: AC
  Filled 2016-06-09: qty 2

## 2016-06-09 MED ORDER — PRENATAL MULTIVITAMIN CH
1.0000 | ORAL_TABLET | Freq: Every day | ORAL | Status: DC
  Administered 2016-06-10 – 2016-06-12 (×3): 1 via ORAL
  Filled 2016-06-09 (×3): qty 1

## 2016-06-09 MED ORDER — ACETAMINOPHEN 325 MG PO TABS: 650.0000 mg | ORAL_TABLET | ORAL | Status: DC | PRN

## 2016-06-09 MED ORDER — NALBUPHINE HCL 10 MG/ML IJ SOLN: 5.0000 mg | INTRAMUSCULAR | Status: DC | PRN

## 2016-06-09 MED ORDER — ONDANSETRON HCL 4 MG/2ML IJ SOLN: 4.0000 mg | Freq: Three times a day (TID) | INTRAMUSCULAR | Status: DC | PRN

## 2016-06-09 MED ORDER — SIMETHICONE 80 MG PO CHEW: 80.0000 mg | CHEWABLE_TABLET | ORAL | Status: DC

## 2016-06-09 MED ORDER — LACTATED RINGERS IV BOLUS (SEPSIS)
1000.0000 mL | Freq: Once | INTRAVENOUS | Status: AC
  Administered 2016-06-09: 400 mL via INTRAVENOUS
  Administered 2016-06-09: 1000 mL via INTRAVENOUS
  Administered 2016-06-09: 600 mL via INTRAVENOUS

## 2016-06-09 MED ORDER — LIDOCAINE HCL (PF) 1 % IJ SOLN
INTRAMUSCULAR | Status: DC | PRN
Start: 2016-06-09 — End: 2016-06-09
  Administered 2016-06-09: 1 mL via SUBCUTANEOUS

## 2016-06-09 MED ORDER — DIBUCAINE 1 % RE OINT: 1.0000 "application " | TOPICAL_OINTMENT | RECTAL | Status: DC | PRN

## 2016-06-09 MED ORDER — WITCH HAZEL-GLYCERIN EX PADS: 1.0000 "application " | MEDICATED_PAD | CUTANEOUS | Status: DC | PRN

## 2016-06-09 MED ORDER — ZOLPIDEM TARTRATE 5 MG PO TABS
5.0000 mg | ORAL_TABLET | Freq: Every evening | ORAL | Status: DC | PRN
Start: 2016-06-09 — End: 2016-06-12

## 2016-06-09 MED ORDER — LACTATED RINGERS IV BOLUS (SEPSIS)
500.0000 mL | Freq: Once | INTRAVENOUS | Status: AC
  Administered 2016-06-09: 500 mL via INTRAVENOUS

## 2016-06-09 MED ORDER — SIMETHICONE 80 MG PO CHEW
80.0000 mg | CHEWABLE_TABLET | Freq: Three times a day (TID) | ORAL | Status: DC
  Administered 2016-06-10 – 2016-06-12 (×6): 80 mg via ORAL
  Filled 2016-06-09 (×6): qty 1

## 2016-06-09 MED ORDER — LIDOCAINE 5 % EX PTCH
1.0000 | MEDICATED_PATCH | CUTANEOUS | Status: DC
  Administered 2016-06-09 – 2016-06-12 (×3): 1 via TRANSDERMAL
  Filled 2016-06-09 (×4): qty 1

## 2016-06-09 MED ORDER — KETOROLAC TROMETHAMINE 30 MG/ML IJ SOLN
30.0000 mg | Freq: Four times a day (QID) | INTRAMUSCULAR | Status: AC | PRN
  Administered 2016-06-09 – 2016-06-10 (×4): 30 mg via INTRAVENOUS
  Filled 2016-06-09 (×4): qty 1

## 2016-06-09 MED ORDER — NALOXONE HCL 0.4 MG/ML IJ SOLN: 0.4000 mg | INTRAMUSCULAR | Status: DC | PRN

## 2016-06-09 MED ORDER — TETANUS-DIPHTH-ACELL PERTUSSIS 5-2.5-18.5 LF-MCG/0.5 IM SUSP: 0.5000 mL | Freq: Once | INTRAMUSCULAR | Status: DC

## 2016-06-09 MED ORDER — ACETAMINOPHEN 500 MG PO TABS
1000.0000 mg | ORAL_TABLET | Freq: Four times a day (QID) | ORAL | Status: AC
  Administered 2016-06-10 (×2): 1000 mg via ORAL
  Filled 2016-06-09 (×2): qty 2

## 2016-06-09 MED ORDER — FENTANYL CITRATE (PF) 100 MCG/2ML IJ SOLN
25.0000 ug | INTRAMUSCULAR | Status: DC | PRN
  Administered 2016-06-09 (×3): 50 ug via INTRAVENOUS
  Filled 2016-06-09 (×2): qty 2

## 2016-06-09 MED ORDER — SOD CITRATE-CITRIC ACID 500-334 MG/5ML PO SOLN
30.0000 mL | ORAL | Status: AC
  Administered 2016-06-09: 30 mL via ORAL
  Filled 2016-06-09: qty 30

## 2016-06-09 MED ORDER — LACTATED RINGERS IV SOLN
INTRAVENOUS | Status: DC
  Administered 2016-06-09 – 2016-06-10 (×2): via INTRAVENOUS

## 2016-06-09 MED ORDER — COCONUT OIL OIL
1.0000 "application " | TOPICAL_OIL | Status: DC | PRN
  Filled 2016-06-09: qty 120

## 2016-06-09 MED ORDER — OXYCODONE-ACETAMINOPHEN 5-325 MG PO TABS
2.0000 | ORAL_TABLET | ORAL | Status: DC | PRN
  Administered 2016-06-10: 2 via ORAL
  Filled 2016-06-09 (×2): qty 2

## 2016-06-09 MED ORDER — IBUPROFEN 600 MG PO TABS
600.0000 mg | ORAL_TABLET | Freq: Four times a day (QID) | ORAL | Status: DC
  Administered 2016-06-10 – 2016-06-12 (×7): 600 mg via ORAL
  Filled 2016-06-09 (×7): qty 1

## 2016-06-09 MED ORDER — SIMETHICONE 80 MG PO CHEW: 80.0000 mg | CHEWABLE_TABLET | ORAL | Status: DC | PRN

## 2016-06-09 MED ORDER — NALBUPHINE HCL 10 MG/ML IJ SOLN: 5.0000 mg | Freq: Once | INTRAMUSCULAR | Status: DC | PRN

## 2016-06-09 MED ORDER — DIPHENHYDRAMINE HCL 50 MG/ML IJ SOLN: 12.5000 mg | INTRAMUSCULAR | Status: DC | PRN

## 2016-06-09 MED ORDER — OXYTOCIN 40 UNITS IN LACTATED RINGERS INFUSION - SIMPLE MED
INTRAVENOUS | Status: DC | PRN
  Administered 2016-06-09 (×2): 4 mL via INTRAVENOUS

## 2016-06-09 MED ORDER — SODIUM CHLORIDE 0.9% FLUSH: 3.0000 mL | INTRAVENOUS | Status: DC | PRN

## 2016-06-09 MED ORDER — MORPHINE SULFATE (PF) 0.5 MG/ML IJ SOLN
INTRAMUSCULAR | Status: DC | PRN
Start: 2016-06-09 — End: 2016-06-09
  Administered 2016-06-09: .1 mg via EPIDURAL

## 2016-06-09 MED ORDER — DIPHENHYDRAMINE HCL 25 MG PO CAPS: 25.0000 mg | ORAL_CAPSULE | ORAL | Status: DC | PRN

## 2016-06-09 MED ORDER — OXYCODONE HCL 5 MG PO TABS: 5.0000 mg | ORAL_TABLET | Freq: Four times a day (QID) | ORAL | Status: AC | PRN

## 2016-06-09 MED ORDER — MORPHINE SULFATE (PF) 0.5 MG/ML IJ SOLN
INTRAMUSCULAR | Status: AC
  Filled 2016-06-09: qty 10

## 2016-06-09 MED ORDER — KETOROLAC TROMETHAMINE 30 MG/ML IJ SOLN: 30.0000 mg | Freq: Four times a day (QID) | INTRAMUSCULAR | Status: AC | PRN

## 2016-06-09 MED ORDER — CEFAZOLIN SODIUM-DEXTROSE 2-3 GM-% IV SOLR
2.0000 g | Freq: Once | INTRAVENOUS | Status: AC
  Administered 2016-06-09: 2 g via INTRAVENOUS
  Filled 2016-06-09: qty 50

## 2016-06-09 MED ORDER — OXYTOCIN 40 UNITS IN LACTATED RINGERS INFUSION - SIMPLE MED: 2.5000 [IU]/h | INTRAVENOUS | Status: AC

## 2016-06-09 SURGICAL SUPPLY — 30 items
BARRIER ADHS 3X4 INTERCEED (GAUZE/BANDAGES/DRESSINGS) ×5 IMPLANT
BLADE SURG 15 STRL LF DISP TIS (BLADE) ×3 IMPLANT
BLADE SURG 15 STRL SS (BLADE) ×2
CANISTER SUCT 3000ML (MISCELLANEOUS) ×5 IMPLANT
CATH KIT ON-Q SILVERSOAK 5IN (CATHETERS) ×10 IMPLANT
CHLORAPREP W/TINT 26ML (MISCELLANEOUS) ×5 IMPLANT
COVER LIGHT HANDLE STERIS (MISCELLANEOUS) ×10 IMPLANT
DRSG TELFA 3X8 NADH (GAUZE/BANDAGES/DRESSINGS) ×5 IMPLANT
ELECT CAUTERY BLADE 6.4 (BLADE) ×5 IMPLANT
ELECT REM PT RETURN 9FT ADLT (ELECTROSURGICAL) ×5
ELECTRODE REM PT RTRN 9FT ADLT (ELECTROSURGICAL) ×3 IMPLANT
GAUZE SPONGE 4X4 12PLY STRL (GAUZE/BANDAGES/DRESSINGS) ×5 IMPLANT
GLOVE BIO SURGEON STRL SZ8 (GLOVE) ×5 IMPLANT
GOWN STRL REUS W/ TWL LRG LVL3 (GOWN DISPOSABLE) ×6 IMPLANT
GOWN STRL REUS W/ TWL XL LVL3 (GOWN DISPOSABLE) ×3 IMPLANT
GOWN STRL REUS W/TWL LRG LVL3 (GOWN DISPOSABLE) ×4
GOWN STRL REUS W/TWL XL LVL3 (GOWN DISPOSABLE) ×2
NS IRRIG 1000ML POUR BTL (IV SOLUTION) ×5 IMPLANT
PACK C SECTION AR (MISCELLANEOUS) ×5 IMPLANT
PAD OB MATERNITY 4.3X12.25 (PERSONAL CARE ITEMS) ×5 IMPLANT
PAD PREP 24X41 OB/GYN DISP (PERSONAL CARE ITEMS) ×5 IMPLANT
SPONGE LAP 18X18 5 PK (GAUZE/BANDAGES/DRESSINGS) ×10 IMPLANT
STAPLER INSORB 30 2030 C-SECTI (MISCELLANEOUS) ×5 IMPLANT
STRAP SAFETY BODY (MISCELLANEOUS) ×5 IMPLANT
SUT CHROMIC 1 CTX 36 (SUTURE) ×15 IMPLANT
SUT CHROMIC 3 0 SH 27 (SUTURE) ×10 IMPLANT
SUT PLAIN GUT 0 (SUTURE) ×10 IMPLANT
SUT VIC AB 0 CT1 36 (SUTURE) ×25 IMPLANT
SUT VIC AB 2-0 SH 27 (SUTURE) ×4
SUT VIC AB 2-0 SH 27XBRD (SUTURE) ×6 IMPLANT

## 2016-06-09 NOTE — Progress Notes (Signed)
Pt is scheduled for primary LTCS and BTL and left ovarian cystectomies . Pt reconfirms sterilization. All questions answered.

## 2016-06-09 NOTE — Anesthesia Procedure Notes (Addendum)
Spinal  Patient location during procedure: OR Start time: 06/09/2016 9:40 AM End time: 06/09/2016 10:15 AM Staffing Anesthesiologist: Andria Frames Resident/CRNA: Jonna Clark Other anesthesia staff: Alvin Critchley Performed: anesthesiologist, resident/CRNA and other anesthesia staff  Preanesthetic Checklist Completed: patient identified, site marked, surgical consent, pre-op evaluation, timeout performed, IV checked, risks and benefits discussed and monitors and equipment checked Spinal Block Patient position: sitting Prep: ChloraPrep Patient monitoring: heart rate, continuous pulse ox, blood pressure and cardiac monitor Approach: midline Location: L2-3 Injection technique: single-shot Needle Needle type: Whitacre and Introducer  Needle gauge: 24 G Needle length: 9 cm Assessment Sensory level: T10 Additional Notes Multiple attempts by multiple providers ([place]). Spine is very deep.  Multiple paresthesias that resolved with needle retraction and redirection.  Patient too deep for paramedian approach.     Negative paresthesia with final attempt. Negative blood return. Positive free-flowing CSF. Expiration date of kit checked and confirmed. Patient tolerated procedure well, without complications.

## 2016-06-09 NOTE — Brief Op Note (Signed)
06/09/2016  11:45 AM  PATIENT:  Pete PeltMariela Rappleye  26 y.o. female  PRE-OPERATIVE DIAGNOSIS:  LGA  History of Shoulder Dystocia  Left Ovarian Cyst  POST-OPERATIVE DIAGNOSIS:  LGA  History of Shoulder Dystocia  Left Ovarian Cyst  PROCEDURE:  Procedure(s) with comments: CESAREAN SECTION WITH BILATERAL TUBAL LIGATION (Bilateral) - Baby Boy @ 1032 Apgars: 9/9 Weight: 9lb 15oz OOPHORECTOMY (Left)  SURGEON:  Surgeon(s) and Role: Panel 1:    * Suzy Bouchardhomas J Schermerhorn, MD - Primary  Panel 2:    * Suzy Bouchardhomas J Schermerhorn, MD - Primary  PHYSICIAN ASSISTANT: Duwaine MaxinSmith , ashley scrub tech  ASSISTANTS: none   ANESTHESIA:   spinal  EBL:  Total I/O In: -  Out: 1000 [Blood:1000]  BLOOD ADMINISTERED:none  DRAINS: Urinary Catheter (Foley)   LOCAL MEDICATIONS USED:  NONE  SPECIMEN:  Source of Specimen:  portion right and left tube, Left ovary and cyst   DISPOSITION OF SPECIMEN:  PATHOLOGY  COUNTS:  YES  TOURNIQUET:  * No tourniquets in log *  DICTATION: .Other Dictation: Dictation Number verbal   PLAN OF CARE: Admit to inpatient   PATIENT DISPOSITION:  PACU - hemodynamically stable.   Delay start of Pharmacological VTE agent (>24hrs) due to surgical blood loss or risk of bleeding: not applicable

## 2016-06-09 NOTE — Progress Notes (Signed)
Notified Lavella LemonsKim Andrews, Rn of bp

## 2016-06-09 NOTE — Anesthesia Preprocedure Evaluation (Signed)
Anesthesia Evaluation  Patient identified by MRN, date of birth, ID band Patient awake    Reviewed: Allergy & Precautions, H&P , NPO status , Patient's Chart, lab work & pertinent test results  History of Anesthesia Complications Negative for: history of anesthetic complications  Airway Mallampati: III  TM Distance: >3 FB Neck ROM: full    Dental  (+) Poor Dentition, Chipped   Pulmonary neg pulmonary ROS, neg shortness of breath,    Pulmonary exam normal breath sounds clear to auscultation       Cardiovascular Exercise Tolerance: Good (-) hypertension(-) angina(-) Past MI and (-) DOE negative cardio ROS Normal cardiovascular exam Rhythm:regular Rate:Normal     Neuro/Psych    GI/Hepatic negative GI ROS, neg GERD  ,  Endo/Other  Morbid obesity  Renal/GU   negative genitourinary   Musculoskeletal   Abdominal   Peds  Hematology negative hematology ROS (+)   Anesthesia Other Findings Past Medical History: No date: Medical history non-contributory  Past Surgical History: No date: CESAREAN SECTION  BMI    Body Mass Index:  45.44 kg/m      Reproductive/Obstetrics (+) Pregnancy                             Anesthesia Physical Anesthesia Plan  ASA: III  Anesthesia Plan: Spinal   Post-op Pain Management:    Induction:   Airway Management Planned:   Additional Equipment:   Intra-op Plan:   Post-operative Plan:   Informed Consent: I have reviewed the patients History and Physical, chart, labs and discussed the procedure including the risks, benefits and alternatives for the proposed anesthesia with the patient or authorized representative who has indicated his/her understanding and acceptance.   Dental Advisory Given  Plan Discussed with: Anesthesiologist  Anesthesia Plan Comments:         Anesthesia Quick Evaluation

## 2016-06-09 NOTE — Op Note (Signed)
NAMLarene Pickett:  Thompson, Alexandra              ACCOUNT NO.:  [id number]  MEDICAL RECORD NO.:  [id number]  LOCATION:                                 FACILITY:  PHYSICIAN:  Jennell Cornerhomas Kaleisha Bhargava, MDDATE OF BIRTH:  [birth year]  DATE OF PROCEDURE: DATE OF DISCHARGE:  06/12/2016                              OPERATIVE REPORT   PREOPERATIVE DIAGNOSIS: 1. 39+ 2 weeks estimated gestational age. 2. History of macrosomic infant with shoulder dystocia in prior     pregnancy. 3. This pregnancy is complicated by a large for gestational age. 4. Complex left ovarian cyst. 5. Elective permanent sterilization.  PROCEDURE PERFORMED: 1. Primary low-transverse cesarean section. 2. Bilateral tubal ligation. 3. Left oophorectomy.  SURGEON:  Jennell Cornerhomas Shanieka Blea, MD  ANESTHESIA:  Spinal.  SURGEON:  Jennell Cornerhomas Sumayyah Custodio, MD.  FIRST ASSISTANT:  Duwaine MaxinAshley Smith, scrub tech.  INDICATIONS:  A 26 year old, gravida 4, para 2.  [facility]  The patient with a history of a macrosomic child in the past, that delivery was complicated by shoulder dystocia.  This infant is estimated to be 4020 g one week prior to the surgery date. The patient elects for permanent sterilization.  The patient is also known to have 2 complex cysts; one measuring 10 x 7 x 10 cm and the other one juxtaposed to this measuring 5 x 2 x 3 cm.  DESCRIPTION OF PROCEDURE:  After adequate spinal anesthesia, the patient was placed in a dorsal supine position.  Hip rolled on the right side. The patient's abdomen was prepped and draped in normal sterile fashion. She did receive 2 g IV Ancef prior to commencement of the case.  Time- out was performed.  A Pfannenstiel incision was made 2 fingerbreadths above the symphysis pubis.  Sharp dissection was used to identify the fascia.  Fascia was opened in the midline and opened in a transverse fashion.  The superior aspect of the fascia was grasped with Kocher clamps and the recti  muscles were dissected free.  Inferior aspect of the fascia was grasped with Kocher clamps and the pyramidalis muscles dissected free.  Entry into the peritoneal cavity was accomplished sharply.  Vesicouterine peritoneal fold was identified.  Bladder flap was created and a low-transverse uterine incision was made.  Upon entry into the amniotic cavity, clear fluid resulted.  The incision was extended with blunt transverse traction.  Large fetal head was then delivered with fundal pressure followed by shoulders and body.  A vigorous female was dried on the abdomen for 60 seconds and then passed to nursery staff who assigned Apgar scores of 9 and 9.  Weight 9 pounds 15 ounces.  Delivery time was 1032 on June 09, 2016.  The placenta was then manually delivered and the uterus was exteriorized.  Endometrial cavity was wiped clean with laparotomy tape and the cervix was opened with a ring forceps and this was passed off the operative field. Uterine incision was then closed with 1 chromic suture in a running locking fashion, good approximation of edges.  Good hemostasis noted.  A large complex left ovarian cyst with a calcified nodule on the surface and a gelatinous cyst, which was the smaller cyst juxtaposed  to the larger cyst.  Given the complexity of this ovary and the nodularity, surgeon felt it was optimal to remove the left ovary completely.  Two Kelly clamps were used to clamp the ovary at its base and the ovary and secondary 2 cysts were removed in total without spillage.  An 0 Vicryl was used to close the pedicle.  There was bleeding into the broad ligament which required multiple deep sutures to occlude the uterine supply to this ovary.  Ultimately, good hemostasis was noted.  Fallopian tube was then grasped at the midportion and 2 separate 0 plain gut sutures were applied and a 1.5 cm portion of fallopian tube was removed. Similar procedure was repeated on the patient's right side.   After placing two 0 plain gut sutures, a 1.5 cm portion of fallopian tube was removed.  The patient's abdomen was then irrigated and suctioned and the uterus was placed back into the pelvis.  Uterine incision required 1 additional figure-of-eight suture and paracolic gutters were wiped cleaned with laparotomy tape and all pedicles appeared hemostatic.  The fascia was then closed with 0 Vicryl suture in a running nonlocking fashion, good approximation of edges.  Subcutaneous tissues were irrigated and bovied.  There was some bleeding on the left angle, which required a figure-of-eight 3-0 Vicryl suture.  Dead space was closed with interrupted 3-0 Vicryl suture and the skin was reapproximated with Insorb absorbable staples.  The patient tolerated the procedure well.  ESTIMATED BLOOD LOSS:  1000 mL.  INTRAOPERATIVE FLUIDS:  1400 mL.  URINE OUTPUT:  75 mL.  The patient was taken to recovery room in good condition.    ______________________________ Jennell Corner, MD   ______________________________ Jennell Corner, MD    TS/MEDQ  D:  06/09/2016  T:  06/09/2016  Job:  [id number]

## 2016-06-09 NOTE — Transfer of Care (Signed)
Immediate Anesthesia Transfer of Care Note  Patient: Alexandra Thompson  Procedure(s) Performed: Procedure(s) with comments: CESAREAN SECTION WITH BILATERAL TUBAL LIGATION (Bilateral) - Baby Boy @ 1032 Apgars: 9/9 Weight: 9lb 15oz OOPHORECTOMY (Left)  Patient Location: PACU  Anesthesia Type:Spinal  Level of Consciousness: awake, alert  and oriented  Airway & Oxygen Therapy: Patient Spontanous Breathing  Post-op Assessment: Report given to RN and Post -op Vital signs reviewed and stable  Post vital signs: Reviewed and stable  Last Vitals:  Vitals:   06/09/16 0753 06/09/16 1154  BP: 117/77 124/79  Pulse: (!) 107 76  Resp: 20 18  Temp: 37.1 C 36.4 C    Last Pain:  Vitals:   06/09/16 0753  TempSrc: Oral  PainSc: 0-No pain         Complications: No apparent anesthesia complications

## 2016-06-09 NOTE — Anesthesia Post-op Follow-up Note (Cosign Needed)
Anesthesia QCDR form completed.        

## 2016-06-09 NOTE — Progress Notes (Signed)
Updated Dr. Feliberto GottronSchermerhorn on IV fluid intake, urinary output, and VS up to now. Ordered 500 ml LR bolus and update on UOP after bolus completed.

## 2016-06-09 NOTE — Op Note (Deleted)
  The note originally documented on this encounter has been moved the the encounter in which it belongs.  

## 2016-06-10 LAB — CHLAMYDIA/NGC RT PCR (ARMC ONLY)
CHLAMYDIA TR: NOT DETECTED
N gonorrhoeae: NOT DETECTED

## 2016-06-10 LAB — SURGICAL PATHOLOGY

## 2016-06-10 LAB — CBC
HEMATOCRIT: 28.6 % — AB (ref 35.0–47.0)
HEMOGLOBIN: 10 g/dL — AB (ref 12.0–16.0)
MCH: 30.6 pg (ref 26.0–34.0)
MCHC: 34.9 g/dL (ref 32.0–36.0)
MCV: 87.8 fL (ref 80.0–100.0)
Platelets: 135 10*3/uL — ABNORMAL LOW (ref 150–440)
RBC: 3.26 MIL/uL — AB (ref 3.80–5.20)
RDW: 15.2 % — ABNORMAL HIGH (ref 11.5–14.5)
WBC: 10.9 10*3/uL (ref 3.6–11.0)

## 2016-06-10 MED ORDER — MEASLES, MUMPS & RUBELLA VAC ~~LOC~~ INJ
0.5000 mL | INJECTION | Freq: Once | SUBCUTANEOUS | Status: AC
  Administered 2016-06-12: 0.5 mL via SUBCUTANEOUS
  Filled 2016-06-10 (×2): qty 0.5

## 2016-06-10 MED ORDER — FERROUS SULFATE 325 (65 FE) MG PO TABS
325.0000 mg | ORAL_TABLET | Freq: Two times a day (BID) | ORAL | Status: DC
  Administered 2016-06-10 – 2016-06-12 (×5): 325 mg via ORAL
  Filled 2016-06-10 (×5): qty 1

## 2016-06-10 MED ORDER — INFLUENZA VAC SPLIT QUAD 0.5 ML IM SUSY: 0.5000 mL | PREFILLED_SYRINGE | INTRAMUSCULAR | Status: DC

## 2016-06-10 NOTE — Progress Notes (Addendum)
POSTOPERATIVE DAY # 1 S/P Primary LTCS with BTL    S:         Reports feeling good, reports a little dizziness with getting up             Tolerating po intake / no nausea / no vomiting / no flatus yet/ no BM             Bleeding is light,             Pain controlled with Tylenol and Toradol - but transitioning to Motrin and Percocet             Up ad lib / ambulatory/ voiding QS  Newborn breast feeding going well    O:  VS: BP (!) 101/50 (BP Location: Left Arm)   Pulse (!) 105   Temp 98.4 F (36.9 C) (Oral)   Resp 18   Ht 4' 9"  (1.448 m)   Wt 95.3 kg (210 lb)   LMP 09/01/2015   SpO2 100%   Breastfeeding? Unknown   BMI 45.44 kg/m    LABS:               Recent Labs  06/09/16 1542 06/10/16 0700  WBC 14.7* 10.9  HGB 11.4* 10.0*  PLT 160 135*               Bloodtype: --/--/A POS (02/19 1542)  Rubella:    Non-immune                                            I&O: Intake/Output      02/19 0701 - 02/20 0700 02/20 0701 - 02/21 0700   I.V. (mL/kg) 3114.6 (32.7)    Total Intake(mL/kg) 3114.6 (32.7)    Urine (mL/kg/hr) 1750    Blood 1000    Total Output 2750     Net +364.6                       Physical Exam:             Alert and Oriented X3  Lungs: Clear and unlabored  Heart: regular rate and rhythm / no mumurs  Abdomen: soft, non-tender, non-distended, active bowel sounds in all quadrants              Fundus: firm, non-tender, U-1             Dressing: c/d/i              Incision:  approximated with Insorb staples / no erythema / no ecchymosis / no drainage  Perineum: intact  Lochia: appropriate, no clots  Extremities: +1 BLE edema, no calf pain or tenderness,  A:        POD # 1 S/P Primary LTCS with BTL             ABL Anemia   Rubella Non-immune   P:        Routine postoperative care              Ferrous Sulfate BID with meals  Offer MMR vaccine at discharge - ordered  See lactation today   Continue current care  Lars Pinks, CNM

## 2016-06-10 NOTE — Anesthesia Postprocedure Evaluation (Signed)
Anesthesia Post Note  Patient: Alexandra PeltMariela Thompson  Procedure(s) Performed: Procedure(s) (LRB): CESAREAN SECTION WITH BILATERAL TUBAL LIGATION (Bilateral) OOPHORECTOMY (Left)  Patient location during evaluation: Mother Baby Anesthesia Type: Spinal Level of consciousness: awake, awake and alert and oriented Pain management: pain level controlled Vital Signs Assessment: post-procedure vital signs reviewed and stable Respiratory status: spontaneous breathing Cardiovascular status: blood pressure returned to baseline Postop Assessment: no headache and no backache Anesthetic complications: no     Last Vitals:  Vitals:   06/10/16 0026 06/10/16 0427  BP: (!) 115/58 118/69  Pulse: 87 (!) 107  Resp: 12 20  Temp: 36.7 C 36.7 C    Last Pain:  Vitals:   06/10/16 0530  TempSrc:   PainSc: 6                  Linsay Vogt

## 2016-06-10 NOTE — Discharge Summary (Signed)
Obstetric Discharge Summary   Patient ID: Patient Name: Alexandra Thompson DOB: 09/20/1990 MRN: [id number]  Date of Admission: 06/09/2016 Date of Discharge: 06/12/2016  Primary OB: [facility]  Gestational Age at Delivery: [redacted]w[redacted]d   Antepartum complications: On 06/02/16 that showed EFW 4020 gm ( 94%) and fetal BPD 99%  Pt with a prior delivery complicated by shoulder dystocia ( baby weighed 4791 gm ) She also has 2 complex left ovarian cysts 10x7x10 cm and 5x2 x3 cm possible endometriomas, Obesity, Rubella Non-immune, GBS Positive, Glucose intolerance 3 hr GTT WNL Admitting Diagnosis: Primary LTCS and BTL and left ovarian cystectomies   Secondary Diagnoses: Patient Active Problem List   Diagnosis Date Noted  . Post-operative state 06/09/2016  . First trimester screening 11/29/2015    Date of Delivery: 06/09/16 Delivered By: Dr. Feliberto Gottron  Delivery Type: primary cesarean section, low transverse incision Anesthesia: spinal Placenta: manual  Newborn Data: Live born female  Birth Weight: 9 lb 15.4 oz (4520 g) APGAR: 9, 9   Postpartum Course  Patient had an uncomplicated postpartum course.  By time of discharge on POD# 3, her pain was controlled on oral pain medications; she had appropriate lochia and was ambulating, voiding without difficulty and tolerating regular diet.  She was deemed stable for discharge to home.     Labs: CBC Latest Ref Rng & Units 06/10/2016 06/09/2016 06/09/2016  WBC 3.6 - 11.0 K/uL 10.9 14.7(H) 11.0  Hemoglobin 12.0 - 16.0 g/dL 10.0(L) 11.4(L) 12.9  Hematocrit 35.0 - 47.0 % 28.6(L) 33.1(L) 37.2  Platelets 150 - 440 K/uL 135(L) 160 169   A POS  Physical exam:  BP 112/63   Pulse 82   Temp 98.3 F (36.8 C) (Oral)   Resp 18   Ht 4\' 9"  (1.448 m)   Wt 95.3 kg (210 lb)   LMP 09/01/2015   SpO2 97%   Breastfeeding? Unknown   BMI 45.44 kg/m  General: alert and no distress Pulm: normal respiratory effort Lochia: appropriate Abdomen: soft, NT Uterine Fundus:  firm, below umbilicus Incision: c/d/i, healing well, no significant drainage, no dehiscence, no significant erythema Extremities: No evidence of DVT seen on physical exam. +1 bilateral lower extremity edema.   Disposition: stable, discharge to home Baby Feeding: breast milk and formula Baby Disposition: home with mom  Contraception: BTL  Prenatal Labs:  A Pos Rubella Non-immune GBS Positive Varicella Immune Hepatitis B Negative HIV Negative RPR NR    Plan:  Yareliz Thorstenson was discharged to home in good condition. Follow-up appointment at [facility] in 2 weeks for Post-op, then for 6 week PP visit at [facility] Discharge Instructions: Per After Visit Summary. Activity: Advance as tolerated. Pelvic rest for 6 weeks.  Refer to After Visit Summary Diet: Regular Discharge Medications: Allergies as of 06/12/2016   No Known Allergies     Medication List    TAKE these medications   acetaminophen 325 MG tablet Commonly known as:  TYLENOL Take 325 mg by mouth every 6 (six) hours as needed for headache.   ferrous sulfate 325 (65 FE) MG tablet Take 1 tablet (325 mg total) by mouth 2 (two) times daily with a meal.   ibuprofen 600 MG tablet Commonly known as:  ADVIL,MOTRIN Take 1 tablet (600 mg total) by mouth every 6 (six) hours as needed.   oxyCODONE-acetaminophen 5-325 MG tablet Commonly known as:  PERCOCET/ROXICET Take 1 tablet by mouth every 4 (four) hours as needed (pain scale 4-7).   prenatal multivitamin Tabs tablet Take 1 tablet  by mouth daily at 12 noon.   senna-docusate 8.6-50 MG tablet Commonly known as:  Senokot-S Take 2 tablets by mouth daily. Start taking on:  06/13/2016      Outpatient follow up:  Follow-up Information    SCHERMERHORN,THOMAS, MD. Schedule an appointment as soon as possible for a visit in 2 week(s).   Specialty:  Obstetrics and Gynecology Why:  Post-op  Contact information: [address] [facility]  [place] [id number] [phone]        [facility]. Schedule an appointment as soon as possible for a visit in 6 week(s).   Why:  Postpartum visit Contact information: [address] [place] [phone]            Signed:  Elenora Fenderhelsea C Ward

## 2016-06-10 NOTE — Anesthesia Post-op Follow-up Note (Signed)
  Anesthesia Pain Follow-up Note  Patient: Alexandra PeltMariela Feld  Day #: 1  Date of Follow-up: 06/10/2016 Time: 7:13 AM  Last Vitals:  Vitals:   06/10/16 0026 06/10/16 0427  BP: (!) 115/58 118/69  Pulse: 87 (!) 107  Resp: 12 20  Temp: 36.7 C 36.7 C    Level of Consciousness: alert  Pain: none   Side Effects:None  Catheter Site Exam:clean, dry, no drainage     Plan: D/C from anesthesia care at surgeon's request  Karoline Caldwelleana Shantera Monts

## 2016-06-11 MED ORDER — CALCIUM CARBONATE ANTACID 500 MG PO CHEW
2.0000 | CHEWABLE_TABLET | Freq: Two times a day (BID) | ORAL | Status: DC | PRN
  Administered 2016-06-11: 400 mg via ORAL
  Filled 2016-06-11: qty 2

## 2016-06-11 NOTE — Lactation Note (Signed)
This note was copied from a baby's chart. Lactation Consultation Note  Patient Name: Alexandra Thompson ZOXWR'UToday's Date: 06/11/2016   Moms breasts are fuller today. We are hearing a lot more swallows today. I taught Mom how to avoid over heating him with clothes and blankets, especially during feeds so he will be more alert to feed longer and better. I taught her breast compression. She is independent with correct position and latch. I just know that some of the feeds were spaced out a lot yesterday and he was sleepy at many of them. Now that those issues are being addressed, he is feeding and stooling better. Mom was exhausted this morning, so gave him 10 ml formula in slow flow. I reviewed signs of well fed baby as per her BF booklet and when to call for help. She has info on [facility] [employer] support group. I gave her and taught her manual hand pump for prn use. [name] [name]. Mom denies any more feedign questions or help needed.   Maternal Data    Feeding Feeding Type: Breast Fed  LATCH Score/Interventions Latch: Grasps breast easily, tongue down, lips flanged, rhythmical sucking.  Audible Swallowing: Spontaneous and intermittent  Type of Nipple: Everted at rest and after stimulation  Comfort (Breast/Nipple): Soft / non-tender     Hold (Positioning): No assistance needed to correctly position infant at breast.  LATCH Score: 10  Lactation Tools Discussed/Used     Consult Status      Sunday CornSandra Clark Jalise Zawistowski 06/11/2016, 5:54 PM

## 2016-06-11 NOTE — Discharge Summary (Signed)
Duplicate document

## 2016-06-11 NOTE — Progress Notes (Addendum)
POSTOPERATIVE DAY # 2 S/P Primary LTCS with BTL    S:         Reports feeling good             Tolerating po intake / no nausea / no vomiting / + flatus / no BM             Bleeding is light             Pain controlled with Motrin and Percocet             Up ad lib / ambulatory/ voiding QS  Newborn breast feeding    O:  VS: BP (!) 116/52 (BP Location: Left Arm)   Pulse 90   Temp 98 F (36.7 C) (Oral)   Resp 20   Ht _0  (1.448 m)   Wt 95.3 kg (210 lb)   LMP 09/01/2015   SpO2 97%   Breastfeeding? Unknown   BMI 45.44 kg/m    LABS:               Recent Labs  06/09/16 1542 06/10/16 0700  WBC 14.7* 10.9  HGB 11.4* 10.0*  PLT 160 135*               Bloodtype: --/--/A POS (02/19 1542)  Rubella:     Non-immune                                          I&O: Intake/Output      02/20 0701 - 02/21 0700 02/21 0701 - 02/22 0700   P.O. 120    I.V. (mL/kg)     Total Intake(mL/kg) 120 (1.3)    Urine (mL/kg/hr) 2480 (1.1)    Blood     Total Output 2480     Net -2360                       Physical Exam:             Alert and Oriented X3  Lungs: Clear and unlabored  Heart: regular rate and rhythm / no mumurs  Abdomen: soft, non-tender, non-distended, active bowel sounds             Fundus: firm, non-tender, U-2             Dressing: c/d/i              Incision:  approximated with Insorb staples / no erythema / no ecchymosis / no drainage  Perineum: intact  Lochia: appropriate  Extremities: +1 BLE edema, no calf pain or tenderness,  A:        POD # 2 S/P Primary LTCS and BTL for hx of shoulder dystocia            ABL Anemia - on FE  Rubella Non-immune  P:        Routine postoperative care              Offer MMR vaccine prior to discharge  May shower and remove dressing today   Pt. Desires early discharge this afternoon if possible - advised to notify peds. If not, will plan for discharge tomorrow.   Lars Pinks, CNM

## 2016-06-12 MED ORDER — IBUPROFEN 600 MG PO TABS: 600.0000 mg | ORAL_TABLET | Freq: Four times a day (QID) | ORAL | 1 refills | Status: DC | PRN

## 2016-06-12 MED ORDER — SENNOSIDES-DOCUSATE SODIUM 8.6-50 MG PO TABS: 2.0000 | ORAL_TABLET | ORAL | 3 refills | Status: DC

## 2016-06-12 MED ORDER — FERROUS SULFATE 325 (65 FE) MG PO TABS: 325.0000 mg | ORAL_TABLET | Freq: Two times a day (BID) | ORAL | 3 refills | Status: DC

## 2016-06-12 MED ORDER — OXYCODONE-ACETAMINOPHEN 5-325 MG PO TABS: 1.0000 | ORAL_TABLET | ORAL | 0 refills | Status: DC | PRN

## 2016-06-12 NOTE — Lactation Note (Signed)
This note was copied from a baby's chart. Lactation Consultation Note  Patient Name: Alexandra Thompson OZHYQ'MToday's Date: 06/12/2016 Reason for consult: Follow-up assessment   Maternal Data Formula Feeding for Exclusion: No Has patient been taught Hand Expression?: Yes Does the patient have breastfeeding experience prior to this delivery?: Yes  Feeding Feeding Type: Breast Fed  Mtoerh will offer the breast then supplement with about 30 ml of pumped milk and or formula. [facility]                      Lactation Tools Discussed/Used     Consult Status      [name] 06/12/2016, 10:21 AM

## 2016-06-12 NOTE — Discharge Instructions (Signed)

## 2016-07-22 ENCOUNTER — Emergency Department: Payer: Medicaid Other

## 2016-07-22 ENCOUNTER — Encounter: Payer: Self-pay | Admitting: Emergency Medicine

## 2016-07-22 ENCOUNTER — Inpatient Hospital Stay
Admission: EM | Admit: 2016-07-22 | Discharge: 2016-07-25 | DRG: 769 | Disposition: A | Discharge status: Home / Self Care | Payer: Medicaid Other | Attending: Internal Medicine | Admitting: Internal Medicine

## 2016-07-22 DIAGNOSIS — K851 Biliary acute pancreatitis without necrosis or infection: Secondary | ICD-10-CM | POA: Diagnosis present

## 2016-07-22 DIAGNOSIS — O9963 Diseases of the digestive system complicating the puerperium: Secondary | ICD-10-CM | POA: Diagnosis present

## 2016-07-22 DIAGNOSIS — R7989 Other specified abnormal findings of blood chemistry: Secondary | ICD-10-CM

## 2016-07-22 DIAGNOSIS — Z8719 Personal history of other diseases of the digestive system: Secondary | ICD-10-CM | POA: Diagnosis present

## 2016-07-22 DIAGNOSIS — R945 Abnormal results of liver function studies: Secondary | ICD-10-CM

## 2016-07-22 DIAGNOSIS — K859 Acute pancreatitis without necrosis or infection, unspecified: Secondary | ICD-10-CM | POA: Diagnosis not present

## 2016-07-22 DIAGNOSIS — K819 Cholecystitis, unspecified: Secondary | ICD-10-CM

## 2016-07-22 DIAGNOSIS — R1013 Epigastric pain: Secondary | ICD-10-CM

## 2016-07-22 DIAGNOSIS — R1012 Left upper quadrant pain: Secondary | ICD-10-CM | POA: Diagnosis present

## 2016-07-22 LAB — CBC WITH DIFFERENTIAL/PLATELET
BASOS ABS: 0 10*3/uL (ref 0–0.1)
BASOS PCT: 0 %
EOS ABS: 0 10*3/uL (ref 0–0.7)
EOS PCT: 0 %
HCT: 36.4 % (ref 35.0–47.0)
Hemoglobin: 11.9 g/dL — ABNORMAL LOW (ref 12.0–16.0)
Lymphocytes Relative: 9 %
Lymphs Abs: 1.2 10*3/uL (ref 1.0–3.6)
MCH: 27.9 pg (ref 26.0–34.0)
MCHC: 32.6 g/dL (ref 32.0–36.0)
MCV: 85.5 fL (ref 80.0–100.0)
MONO ABS: 0.5 10*3/uL (ref 0.2–0.9)
Monocytes Relative: 4 %
Neutro Abs: 11.4 10*3/uL — ABNORMAL HIGH (ref 1.4–6.5)
Neutrophils Relative %: 87 %
PLATELETS: 223 10*3/uL (ref 150–440)
RBC: 4.25 MIL/uL (ref 3.80–5.20)
RDW: 14.3 % (ref 11.5–14.5)
WBC: 13.2 10*3/uL — AB (ref 3.6–11.0)

## 2016-07-22 LAB — COMPREHENSIVE METABOLIC PANEL
ALT: 271 U/L — AB (ref 14–54)
AST: 258 U/L — AB (ref 15–41)
Albumin: 3.4 g/dL — ABNORMAL LOW (ref 3.5–5.0)
Alkaline Phosphatase: 200 U/L — ABNORMAL HIGH (ref 38–126)
Anion gap: 5 (ref 5–15)
BUN: 10 mg/dL (ref 6–20)
CHLORIDE: 104 mmol/L (ref 101–111)
CO2: 26 mmol/L (ref 22–32)
Calcium: 8.3 mg/dL — ABNORMAL LOW (ref 8.9–10.3)
Creatinine, Ser: 0.73 mg/dL (ref 0.44–1.00)
GFR calc Af Amer: >60 mL/min (ref >60)
Glucose, Bld: 116 mg/dL — ABNORMAL HIGH (ref 65–99)
POTASSIUM: 3.6 mmol/L (ref 3.5–5.1)
SODIUM: 135 mmol/L (ref 135–145)
Total Bilirubin: 2.1 mg/dL — ABNORMAL HIGH (ref 0.3–1.2)
Total Protein: 7 g/dL (ref 6.5–8.1)

## 2016-07-22 LAB — LIPASE, BLOOD: LIPASE: 283 U/L — AB (ref 11–51)

## 2016-07-22 LAB — POCT PREGNANCY, URINE: Preg Test, Ur: NEGATIVE

## 2016-07-22 MED ORDER — SODIUM CHLORIDE 0.9 % IV SOLN
INTRAVENOUS | Status: DC
  Administered 2016-07-22 – 2016-07-23 (×4): via INTRAVENOUS

## 2016-07-22 MED ORDER — PROMETHAZINE HCL 25 MG/ML IJ SOLN: 12.5000 mg | Freq: Once | INTRAMUSCULAR | Status: DC

## 2016-07-22 MED ORDER — MORPHINE SULFATE (PF) 4 MG/ML IV SOLN: 4.0000 mg | INTRAVENOUS | Status: DC | PRN

## 2016-07-22 MED ORDER — IOPAMIDOL (ISOVUE-300) INJECTION 61%
100.0000 mL | Freq: Once | INTRAVENOUS | Status: AC | PRN
  Administered 2016-07-22: 100 mL via INTRAVENOUS

## 2016-07-22 MED ORDER — ENOXAPARIN SODIUM 40 MG/0.4ML ~~LOC~~ SOLN
40.0000 mg | SUBCUTANEOUS | Status: DC
  Administered 2016-07-23 – 2016-07-24 (×2): 40 mg via SUBCUTANEOUS
  Filled 2016-07-22 (×2): qty 0.4

## 2016-07-22 MED ORDER — ACETAMINOPHEN 325 MG PO TABS: 650.0000 mg | ORAL_TABLET | Freq: Four times a day (QID) | ORAL | Status: DC | PRN

## 2016-07-22 MED ORDER — ONDANSETRON HCL 4 MG PO TABS: 4.0000 mg | ORAL_TABLET | Freq: Four times a day (QID) | ORAL | Status: DC | PRN

## 2016-07-22 MED ORDER — SODIUM CHLORIDE 0.9 % IV SOLN: Freq: Once | INTRAVENOUS | Status: DC

## 2016-07-22 MED ORDER — POLYETHYLENE GLYCOL 3350 17 G PO PACK
17.0000 g | PACK | Freq: Every day | ORAL | Status: DC | PRN
Start: 2016-07-22 — End: 2016-07-25
  Filled 2016-07-22: qty 1

## 2016-07-22 MED ORDER — ACETAMINOPHEN 650 MG RE SUPP
650.0000 mg | Freq: Four times a day (QID) | RECTAL | Status: DC | PRN
  Filled 2016-07-22: qty 1

## 2016-07-22 MED ORDER — MORPHINE SULFATE (PF) 4 MG/ML IV SOLN
4.0000 mg | INTRAVENOUS | Status: DC | PRN
  Administered 2016-07-22 – 2016-07-25 (×2): 4 mg via INTRAVENOUS
  Filled 2016-07-22 (×3): qty 1

## 2016-07-22 MED ORDER — SODIUM CHLORIDE 0.9 % IV BOLUS (SEPSIS)
1000.0000 mL | Freq: Once | INTRAVENOUS | Status: AC
  Administered 2016-07-22: 1000 mL via INTRAVENOUS

## 2016-07-22 MED ORDER — IBUPROFEN 400 MG PO TABS
400.0000 mg | ORAL_TABLET | Freq: Four times a day (QID) | ORAL | Status: DC | PRN
  Administered 2016-07-25 (×2): 400 mg via ORAL
  Filled 2016-07-22 (×2): qty 1

## 2016-07-22 MED ORDER — ONDANSETRON HCL 4 MG/2ML IJ SOLN
4.0000 mg | Freq: Four times a day (QID) | INTRAMUSCULAR | Status: DC | PRN
  Administered 2016-07-23: 4 mg via INTRAVENOUS
  Filled 2016-07-22: qty 2

## 2016-07-22 NOTE — ED Provider Notes (Signed)
[facility] Emergency Department Provider Note    First MD Initiated Contact with Patient 07/22/16 1806     (approximate)  I have reviewed the triage vital signs and the nursing notes.   HISTORY  Chief Complaint Abdominal Pain    HPI Alexandra Thompson is a 26 y.o. female who presents with abdominal pain on and off for the past several weeks that became acutely worse today. Patient was seen in [place] [facility] and had bloodwork done. Is having associated nausea and vomiting. Patient states the pain does radiate through to her back. States that he'll wax and wane between a 6 out of 10 in severity and a 10 out of 10 in severity. 6 weeks status post C-section but was otherwise uncomplicated. Denies any vaginal discharge. No measured fevers. No chest pain or shortness of breath.  [facility] clinic showed no excessive ptosis but had a left shift as well as elevated transaminases and a lipase greater than 1800  Past Medical History:  Diagnosis Date  . Medical history non-contributory    History reviewed. No pertinent family history. Past Surgical History:  Procedure Laterality Date  . CESAREAN SECTION    . CESAREAN SECTION WITH BILATERAL TUBAL LIGATION Bilateral 06/09/2016   Procedure: CESAREAN SECTION WITH BILATERAL TUBAL LIGATION;  Surgeon: Suzy Bouchard, MD;  Location: [facility] ORS;  Service: Obstetrics;  Laterality: Bilateral;  Baby Boy @ 1032 Apgars: 9/9 Weight: 9lb 15oz  . OOPHORECTOMY Left 06/09/2016   Procedure: OOPHORECTOMY;  Surgeon: Suzy Bouchard, MD;  Location: [facility] ORS;  Service: Gynecology;  Laterality: Left;   Patient Active Problem List   Diagnosis Date Noted  . Post-operative state 06/09/2016  . First trimester screening 11/29/2015      Prior to Admission medications   Medication Sig Start Date End Date Taking? Authorizing Provider  acetaminophen (TYLENOL) 325 MG tablet Take 325 mg by mouth every 6 (six) hours as  needed for headache.   Yes Historical Provider, MD  ferrous sulfate 325 (65 FE) MG tablet Take 1 tablet (325 mg total) by mouth 2 (two) times daily with a meal. Patient not taking: Reported on 07/22/2016 06/12/16   Elenora Fender Ward, MD  ibuprofen (ADVIL,MOTRIN) 600 MG tablet Take 1 tablet (600 mg total) by mouth every 6 (six) hours as needed. Patient not taking: Reported on 07/22/2016 06/12/16   Elenora Fender Ward, MD  oxyCODONE-acetaminophen (PERCOCET/ROXICET) 5-325 MG tablet Take 1 tablet by mouth every 4 (four) hours as needed (pain scale 4-7). Patient not taking: Reported on 07/22/2016 06/12/16   Elenora Fender Ward, MD  Prenatal Vit-Fe Fumarate-FA (PRENATAL MULTIVITAMIN) TABS tablet Take 1 tablet by mouth daily at 12 noon.    Historical Provider, MD  senna-docusate (SENOKOT-S) 8.6-50 MG tablet Take 2 tablets by mouth daily. Patient not taking: Reported on 07/22/2016 06/13/16   Elenora Fender Ward, MD    Allergies Patient has no known allergies.    Social History Social History  Substance Use Topics  . Smoking status: Never Smoker  . Smokeless tobacco: Never Used  . Alcohol use No    Review of Systems Patient denies headaches, rhinorrhea, blurry vision, numbness, shortness of breath, chest pain, edema, cough, abdominal pain, nausea, vomiting, diarrhea, dysuria, fevers, rashes or hallucinations unless otherwise stated above in HPI. ____________________________________________   PHYSICAL EXAM:  VITAL SIGNS: Vitals:   07/22/16 1900 07/22/16 2000  BP: 112/62 105/63  Pulse: 70 81  Resp:    Temp:      Constitutional: Alert  and oriented. ill appearing but in no acute distress. Eyes: Conjunctivae are normal. PERRL. EOMI. Head: Atraumatic. Nose: No congestion/rhinnorhea. Mouth/Throat: Mucous membranes are moist.  Oropharynx non-erythematous. Neck: No stridor. Painless ROM. No cervical spine tenderness to palpation Hematological/Lymphatic/Immunilogical: No cervical lymphadenopathy. Cardiovascular:  Normal rate, regular rhythm. Grossly normal heart sounds.  Good peripheral circulation. Respiratory: Normal respiratory effort.  No retractions. Lungs CTAB. Gastrointestinal: Soft with + RUQ ttp. No distention. No abdominal bruits. No CVA tenderness. Musculoskeletal: No lower extremity tenderness nor edema.  No joint effusions. Neurologic:  Normal speech and language. No gross focal neurologic deficits are appreciated. No gait instability. Skin:  Skin is warm, dry and intact. No rash noted. Psychiatric: Mood and affect are normal. Speech and behavior are normal.  ____________________________________________   LABS (all labs ordered are listed, but only abnormal results are displayed)  Results for orders placed or performed during the hospital encounter of 07/22/16 (from the past 24 hour(s))  CBC with Differential/Platelet     Status: Abnormal   Collection Time: 07/22/16  7:51 PM  Result Value Ref Range   WBC 13.2 (H) 3.6 - 11.0 K/uL   RBC 4.25 3.80 - 5.20 MIL/uL   Hemoglobin 11.9 (L) 12.0 - 16.0 g/dL   HCT 16.1 09.6 - 04.5 %   MCV 85.5 80.0 - 100.0 fL   MCH 27.9 26.0 - 34.0 pg   MCHC 32.6 32.0 - 36.0 g/dL   RDW 40.9 81.1 - 91.4 %   Platelets 223 150 - 440 K/uL   Neutrophils Relative % 87 %   Neutro Abs 11.4 (H) 1.4 - 6.5 K/uL   Lymphocytes Relative 9 %   Lymphs Abs 1.2 1.0 - 3.6 K/uL   Monocytes Relative 4 %   Monocytes Absolute 0.5 0.2 - 0.9 K/uL   Eosinophils Relative 0 %   Eosinophils Absolute 0.0 0 - 0.7 K/uL   Basophils Relative 0 %   Basophils Absolute 0.0 0 - 0.1 K/uL  Comprehensive metabolic panel     Status: Abnormal   Collection Time: 07/22/16  7:51 PM  Result Value Ref Range   Sodium 135 135 - 145 mmol/L   Potassium 3.6 3.5 - 5.1 mmol/L   Chloride 104 101 - 111 mmol/L   CO2 26 22 - 32 mmol/L   Glucose, Bld 116 (H) 65 - 99 mg/dL   BUN 10 6 - 20 mg/dL   Creatinine, Ser 7.82 0.44 - 1.00 mg/dL   Calcium 8.3 (L) 8.9 - 10.3 mg/dL   Total Protein 7.0 6.5 - 8.1 g/dL     Albumin 3.4 (L) 3.5 - 5.0 g/dL   AST 956 (H) 15 - 41 U/L   ALT 271 (H) 14 - 54 U/L   Alkaline Phosphatase 200 (H) 38 - 126 U/L   Total Bilirubin 2.1 (H) 0.3 - 1.2 mg/dL   GFR calc non Af Amer >60 >60 mL/min   GFR calc Af Amer >60 >60 mL/min   Anion gap 5 5 - 15  Pregnancy, urine POC     Status: None   Collection Time: 07/22/16  8:45 PM  Result Value Ref Range   Preg Test, Ur NEGATIVE NEGATIVE   ____________________________________________  ____________________________________________  RADIOLOGY  I personally reviewed all radiographic images ordered to evaluate for the above acute complaints and reviewed radiology reports and findings.  These findings were personally discussed with the patient.  Please see medical record for radiology report.  ____________________________________________   PROCEDURES  Procedure(s) performed:  Procedures  Critical Care performed: no ____________________________________________   INITIAL IMPRESSION / ASSESSMENT AND PLAN / ED COURSE  Pertinent labs & imaging results that were available during my care of the patient were reviewed by me and considered in my medical decision making (see chart for details).  DDX: pancreatitis, cholecystitis, cholelithiasis, dehydration, gastritis  Zanetta Dehaan is a 26 y.o. who presents to the ED with Elevated lipase and LFTs from [place] [facility] with epigastric pain radiating through to the back concerning for acute pancreatitis. Will order CT imaging to further evaluate for necrosis or abscess as well as any evidence of gallstone. We'll provide IV fluids as well as pain medication.  The patient will be placed on continuous pulse oximetry and telemetry for monitoring.  Laboratory evaluation will be sent to evaluate for the above complaints.     Clinical Course as of Jul 22 2137  Tue Jul 22, 2016  2134 CT with evidence of acute pancreatitis. No evidence of gallstone. Patient will require admission for  bowel rest, IV fluids and pain management.  Have discussed with the patient and available family all diagnostics and treatments performed thus far and all questions were answered to the best of my ability. The patient demonstrates understanding and agreement with plan.   [PR]    Clinical Course User Index [PR] Willy Eddy, MD     ____________________________________________   FINAL CLINICAL IMPRESSION(S) / ED DIAGNOSES  Final diagnoses:  Acute pancreatitis without infection or necrosis, unspecified pancreatitis type  Epigastric pain  Elevated LFTs      NEW MEDICATIONS STARTED DURING THIS VISIT:  New Prescriptions   No medications on file     Note:  This document was prepared using Dragon voice recognition software and may include unintentional dictation errors.    Willy Eddy, MD 07/22/16 2139

## 2016-07-22 NOTE — ED Notes (Signed)
Patient woke up due to abdominal pain this morning at 0300. Patient went to [facility] and was called and instructed to come to ED due to elevated lipase.

## 2016-07-22 NOTE — ED Notes (Signed)
Fluid on hold for blood draw prior to CT contrast. Patient and visitor given warm blankets. No other needs at this time.

## 2016-07-22 NOTE — H&P (Signed)
[employer] -  at [facility]   PATIENT NAME: Alexandra Thompson    MR#:  [id number]  DATE OF BIRTH:  03-23-1991  DATE OF ADMISSION:  07/22/2016  PRIMARY CARE PHYSICIAN: [facility]   REQUESTING/REFERRING PHYSICIAN: Sherral Hammers  CHIEF COMPLAINT:   Chief Complaint  Patient presents with  . Abdominal Pain   HISTORY OF PRESENT ILLNESS:  Alexandra Thompson  is a 26 y.o. female with a known history of Recent C-section presents to the emergency room complaining of acute onset of epigastric and left upper quadrant abdominal pain. Patient has had similar pain on and off which improved with Tums for the past 3 months. But since yesterday this is constant. Her lipase is elevated greater than 1800. Mild elevation in bilirubin and liver function tests. CT scan of the abdomen shows acute pancreatitis with gallstones or sludge. No cholecystitis.  PAST MEDICAL HISTORY:   Past Medical History:  Diagnosis Date  . Medical history non-contributory    PAST SURGICAL HISTORY:   Past Surgical History:  Procedure Laterality Date  . CESAREAN SECTION    . CESAREAN SECTION WITH BILATERAL TUBAL LIGATION Bilateral 06/09/2016   Procedure: CESAREAN SECTION WITH BILATERAL TUBAL LIGATION;  Surgeon: Suzy Bouchard, MD;  Location: [facility] ORS;  Service: Obstetrics;  Laterality: Bilateral;  Baby Boy @ 1032 Apgars: 9/9 Weight: 9lb 15oz  . OOPHORECTOMY Left 06/09/2016   Procedure: OOPHORECTOMY;  Surgeon: Suzy Bouchard, MD;  Location: [facility] ORS;  Service: Gynecology;  Laterality: Left;   SOCIAL HISTORY:   Social History  Substance Use Topics  . Smoking status: Never Smoker  . Smokeless tobacco: Never Used  . Alcohol use No   FAMILY HISTORY:  History reviewed. No pertinent family history. No pancreatitis DRUG ALLERGIES:  No Known Allergies  REVIEW OF SYSTEMS:   Review of Systems  Constitutional: Positive for malaise/fatigue. Negative for chills and fever.   HENT: Negative for sore throat.   Eyes: Negative for blurred vision, double vision and pain.  Respiratory: Negative for cough, hemoptysis, shortness of breath and wheezing.   Cardiovascular: Negative for chest pain, palpitations, orthopnea and leg swelling.  Gastrointestinal: Positive for abdominal pain, nausea and vomiting. Negative for constipation, diarrhea and heartburn.  Genitourinary: Negative for dysuria and hematuria.  Musculoskeletal: Negative for back pain and joint pain.  Skin: Negative for rash.  Neurological: Positive for weakness. Negative for sensory change, speech change, focal weakness and headaches.  Endo/Heme/Allergies: Does not bruise/bleed easily.  Psychiatric/Behavioral: Negative for depression. The patient is not nervous/anxious.     MEDICATIONS AT HOME:   Prior to Admission medications   Medication Sig Start Date End Date Taking? Authorizing Provider  acetaminophen (TYLENOL) 325 MG tablet Take 325 mg by mouth every 6 (six) hours as needed for headache.   Yes Historical Provider, MD  ferrous sulfate 325 (65 FE) MG tablet Take 1 tablet (325 mg total) by mouth 2 (two) times daily with a meal. Patient not taking: Reported on 07/22/2016 06/12/16   Elenora Fender Ward, MD  ibuprofen (ADVIL,MOTRIN) 600 MG tablet Take 1 tablet (600 mg total) by mouth every 6 (six) hours as needed. Patient not taking: Reported on 07/22/2016 06/12/16   Elenora Fender Ward, MD  oxyCODONE-acetaminophen (PERCOCET/ROXICET) 5-325 MG tablet Take 1 tablet by mouth every 4 (four) hours as needed (pain scale 4-7). Patient not taking: Reported on 07/22/2016 06/12/16   Elenora Fender Ward, MD  Prenatal Vit-Fe Fumarate-FA (PRENATAL MULTIVITAMIN) TABS tablet Take 1 tablet by mouth daily at  12 noon.    Historical Provider, MD  senna-docusate (SENOKOT-S) 8.6-50 MG tablet Take 2 tablets by mouth daily. Patient not taking: Reported on 07/22/2016 06/13/16   Elenora Fender Ward, MD     VITAL SIGNS:  Blood pressure 105/63, pulse 81,  temperature 98.3 F (36.8 C), temperature source Oral, resp. rate 16, weight 79.4 kg (175 lb), SpO2 99 %, unknown if currently breastfeeding.  PHYSICAL EXAMINATION:  Physical Exam  GENERAL:  26 y.o.-year-old patient lying in the bed with no acute distress.  EYES: Pupils equal, round, reactive to light and accommodation. No scleral icterus. Extraocular muscles intact.  HEENT: Head atraumatic, normocephalic. Oropharynx and nasopharynx clear. No oropharyngeal erythema, moist oral mucosa  NECK:  Supple, no jugular venous distention. No thyroid enlargement, no tenderness. LUNGS: Normal breath sounds bilaterally, no wheezing, rales, rhonchi. No use of accessory muscles of respiration.  CARDIOVASCULAR: S1, S2 normal. No murmurs, rubs, or gallops.  ABDOMEN: Soft, Left upper quadrant and epigastric tenderness. EXTREMITIES: No pedal edema, cyanosis, or clubbing. + 2 pedal & radial pulses b/l. NEUROLOGIC: Cranial nerves II through XII are intact. No focal Motor or sensory deficits appreciated b/l. PSYCHIATRIC: The patient is alert and oriented x 3. Good affect.  SKIN: No obvious rash, lesion, or ulcer.   LABORATORY PANEL:   CBC  Recent Labs Lab 07/22/16 1951  WBC 13.2*  HGB 11.9*  HCT 36.4  PLT 223   ------------------------------------------------------------------------------------------------------------------  Chemistries   Recent Labs Lab 07/22/16 1951  NA 135  K 3.6  CL 104  CO2 26  GLUCOSE 116*  BUN 10  CREATININE 0.73  CALCIUM 8.3*  AST 258*  ALT 271*  ALKPHOS 200*  BILITOT 2.1*   ------------------------------------------------------------------------------------------------------------------  Cardiac Enzymes No results for input(s): TROPONINI in the last 168 hours. ------------------------------------------------------------------------------------------------------------------  RADIOLOGY:  Ct Abdomen Pelvis W Contrast  Result Date: 07/22/2016 CLINICAL DATA:   Initial evaluation for acute epigastric abdominal pain, elevated lipase, concern for pancreatitis. EXAM: CT ABDOMEN AND PELVIS WITH CONTRAST TECHNIQUE: Multidetector CT imaging of the abdomen and pelvis was performed using the standard protocol following bolus administration of intravenous contrast. CONTRAST:  ISOVUE-300 IOPAMIDOL (ISOVUE-300) INJECTION 61% COMPARISON:  None. FINDINGS: Lower chest: Mild bibasilar atelectatic changes present dependently within the visualized lung bases. Visualized lungs are otherwise clear. Hepatobiliary: Liver demonstrates a normal contrast enhanced appearance. Layering stones and/or sludge present within the gallbladder lumen. No biliary dilatation. Pancreas: Hazy inflammatory stranding seen about the pancreas, concerning for acute pancreatitis. No evidence for pancreatic necrosis. No loculated peripancreatic collections. Spleen: Spleen within normal limits. Adrenals/Urinary Tract: Adrenal glands are normal. Kidneys equal size with symmetric enhancement. No nephrolithiasis, hydronephrosis, or focal enhancing renal mass. No hydroureter. Bladder within normal limits. Stomach/Bowel: Stomach within normal limits. Hazy inflammatory stranding surrounds the duodenum related to the inflammatory process within the adjacent pancreas. No evidence for bowel obstruction. Appendix is normal. No other acute inflammatory changes seen about the bowels. Vascular/Lymphatic: Normal intravascular enhancement seen throughout the intra-abdominal aorta and its branch vessels. Main portal vein and SMV appear patent. No pathologically enlarged intra-abdominal or pelvic lymph nodes identified. Reproductive: Uterus and ovaries within normal limits for age. Other: No free intraperitoneal air. Small volume scattered free fluid about the pancreas. Musculoskeletal: No acute osseous abnormality. No worrisome lytic or blastic osseous lesions. IMPRESSION: 1. Findings consistent with acute pancreatitis. No  complication identified. 2. Layering hyperdensity within the gallbladder lumen, consistent with stones and/or sludge. No biliary dilatation. Electronically Signed   By: Janell Quiet.D.  On: 07/22/2016 21:25     IMPRESSION AND PLAN:   * Acute pancreatitis. Likely a gallstone pancreatitis. Will admit the patient for symptomatic treatment at this point. Pain medications as needed. IV fluids. Repeat CMP and lipase in the morning. Consult surgery.  * Postpartum. Patient continues to breast-feed.  * DVT prophylaxis with Lovenox  All the records are reviewed and case discussed with ED provider. Management plans discussed with the patient, family and they are in agreement.  CODE STATUS: Full code  TOTAL TIME TAKING CARE OF THIS PATIENT: 40 minutes.   Milagros Loll R M.D on 07/22/2016 at 9:55 PM  Between 7am to 6pm - Pager - [phone]  After 6pm go to [email or web address] - password [email or web address] [facility]  [facility]  Office  [phone]  CC: Primary care physician; [facility] Department  Note: This dictation was prepared with Dragon dictation along with smaller phrase technology. Any transcriptional errors that result from this process are unintentional.

## 2016-07-22 NOTE — ED Triage Notes (Signed)
Pt to ed with c/o abd pain, sent from [facility] for elevated lipase.  Pt is 6 weeks post partum.

## 2016-07-23 DIAGNOSIS — K859 Acute pancreatitis without necrosis or infection, unspecified: Secondary | ICD-10-CM

## 2016-07-23 LAB — CBC
HEMATOCRIT: 33.8 % — AB (ref 35.0–47.0)
HEMOGLOBIN: 11.2 g/dL — AB (ref 12.0–16.0)
MCH: 28 pg (ref 26.0–34.0)
MCHC: 33.2 g/dL (ref 32.0–36.0)
MCV: 84.3 fL (ref 80.0–100.0)
Platelets: 222 10*3/uL (ref 150–440)
RBC: 4 MIL/uL (ref 3.80–5.20)
RDW: 14.6 % — ABNORMAL HIGH (ref 11.5–14.5)
WBC: 8.2 10*3/uL (ref 3.6–11.0)

## 2016-07-23 LAB — COMPREHENSIVE METABOLIC PANEL
ALBUMIN: 3.1 g/dL — AB (ref 3.5–5.0)
ALT: 229 U/L — ABNORMAL HIGH (ref 14–54)
ANION GAP: 5 (ref 5–15)
AST: 131 U/L — ABNORMAL HIGH (ref 15–41)
Alkaline Phosphatase: 205 U/L — ABNORMAL HIGH (ref 38–126)
BILIRUBIN TOTAL: 1.1 mg/dL (ref 0.3–1.2)
BUN: 9 mg/dL (ref 6–20)
CALCIUM: 8.4 mg/dL — AB (ref 8.9–10.3)
CO2: 26 mmol/L (ref 22–32)
Chloride: 108 mmol/L (ref 101–111)
Creatinine, Ser: 0.69 mg/dL (ref 0.44–1.00)
GFR calc non Af Amer: >60 mL/min (ref >60)
GLUCOSE: 110 mg/dL — AB (ref 65–99)
POTASSIUM: 3.2 mmol/L — AB (ref 3.5–5.1)
SODIUM: 139 mmol/L (ref 135–145)
TOTAL PROTEIN: 6.4 g/dL — AB (ref 6.5–8.1)

## 2016-07-23 LAB — LIPASE, BLOOD: LIPASE: 702 U/L — AB (ref 11–51)

## 2016-07-23 MED ORDER — POTASSIUM CHLORIDE CRYS ER 20 MEQ PO TBCR
20.0000 meq | EXTENDED_RELEASE_TABLET | Freq: Once | ORAL | Status: AC
  Administered 2016-07-23: 20 meq via ORAL
  Filled 2016-07-23: qty 1

## 2016-07-23 NOTE — Consult Note (Signed)
Patient ID: Alexandra Thompson, female   DOB: [birth year], 26 y.o.   MRN: [id number]  CC: ABDOMINAL PAIN  HPI Alexandra Thompson is a 26 y.o. female who is currently on the medicine service admitted with pancreatitis. Surgery consult was requested by Dr.Konidena for evaluation of likely gallstone pancreatitis. Patient reports that the abdominal pain and brought to the hospital was not leaving she's never had before. She reports that her pain is improving. She is currently breast-feeding. She denies any fevers, chills, nausea, vomiting, chest pain, shortness of breath, diarrhea, constipation. She denies any recent alcohol use. She is otherwise in her usual state of excellent health.  HPI  Past Medical History:  Diagnosis Date  . Medical history non-contributory     Past Surgical History:  Procedure Laterality Date  . CESAREAN SECTION    . CESAREAN SECTION WITH BILATERAL TUBAL LIGATION Bilateral 06/09/2016   Procedure: CESAREAN SECTION WITH BILATERAL TUBAL LIGATION;  Surgeon: Suzy Bouchard, MD;  Location: [facility] ORS;  Service: Obstetrics;  Laterality: Bilateral;  Baby Boy @ 1032 Apgars: 9/9 Weight: 9lb 15oz  . OOPHORECTOMY Left 06/09/2016   Procedure: OOPHORECTOMY;  Surgeon: Suzy Bouchard, MD;  Location: [facility] ORS;  Service: Gynecology;  Laterality: Left;    History reviewed. No pertinent family history.  Social History Social History  Substance Use Topics  . Smoking status: Never Smoker  . Smokeless tobacco: Never Used  . Alcohol use No    No Known Allergies  Current Facility-Administered Medications  Medication Dose Route Frequency Provider Last Rate Last Dose  . 0.9 %  sodium chloride infusion   Intravenous Once Willy Eddy, MD      . 0.9 %  sodium chloride infusion   Intravenous Continuous Katha Hamming, MD 75 mL/hr at 07/23/16 1357    . acetaminophen (TYLENOL) tablet 650 mg  650 mg Oral Q6H PRN Milagros Loll, MD       Or  . acetaminophen (TYLENOL) suppository  650 mg  650 mg Rectal Q6H PRN Srikar Sudini, MD      . enoxaparin (LOVENOX) injection 40 mg  40 mg Subcutaneous Q24H Milagros Loll, MD   40 mg at 07/23/16 0720  . ibuprofen (ADVIL,MOTRIN) tablet 400 mg  400 mg Oral Q6H PRN Srikar Sudini, MD      . morphine 4 MG/ML injection 4 mg  4 mg Intravenous Q3H PRN Milagros Loll, MD   4 mg at 07/22/16 2310  . ondansetron (ZOFRAN) tablet 4 mg  4 mg Oral Q6H PRN Milagros Loll, MD       Or  . ondansetron (ZOFRAN) injection 4 mg  4 mg Intravenous Q6H PRN Milagros Loll, MD   4 mg at 07/23/16 0531  . polyethylene glycol (MIRALAX / GLYCOLAX) packet 17 g  17 g Oral Daily PRN Srikar Sudini, MD      . promethazine (PHENERGAN) injection 12.5 mg  12.5 mg Intravenous Once Willy Eddy, MD         Review of Systems A Multi-point review of systems was asked and was negative except for the findings documented in the history of present illness  Physical Exam Blood pressure (!) 100/53, pulse 82, temperature 98.6 F (37 C), temperature source Oral, resp. rate 18, height  (1.448 m), weight 79.4 kg (175 lb), SpO2 99 %, unknown if currently breastfeeding. CONSTITUTIONAL: No acute distress. EYES: Pupils are equal, round, and reactive to light, Sclera are non-icteric. EARS, NOSE, MOUTH AND THROAT: The oropharynx is clear. The oral mucosa is pink and  moist. Hearing is intact to voice. LYMPH NODES:  Lymph nodes in the neck are normal. RESPIRATORY:  Lungs are clear. There is normal respiratory effort, with equal breath sounds bilaterally, and without pathologic use of accessory muscles. CARDIOVASCULAR: Heart is regular without murmurs, gallops, or rubs. GI: The abdomen is soft, mildly tender in the midepigastrium, and nondistended. There are no palpable masses. There is no hepatosplenomegaly. There are normal bowel sounds in all quadrants. GU: Rectal deferred.   MUSCULOSKELETAL: Normal muscle strength and tone. No cyanosis or edema.   SKIN: Turgor is good and there  are no pathologic skin lesions or ulcers. NEUROLOGIC: Motor and sensation is grossly normal. Cranial nerves are grossly intact. PSYCH:  Oriented to person, place and time. Affect is normal.  Data Reviewed Images and labs reviewed. White blood cell count today is normally 0.2, lipase is elevated 702 which is increased from 283. Alkaline phosphatase, AST, ALT are all elevated. Mild hypokalemia 3.2. CT scan of the abdomen confirms pancreatitis and shows cholelithiasis without cholecystitis. I have personally reviewed the patient's imaging, laboratory findings and medical records.    Assessment    Pancreatitis    Plan    26 year old female with pancreatitis, likely gallstone in nature. Patient has clinically improved dramatically from admission however her lipase has increased since admission. Discussed at length with the patient that we would recheck her labs in the morning to determine the most appropriate course. Should her labs normalize she'll be offered laparoscopic cholecystectomy prior to discharge, therefore the choice of comeback to clinic to scheduled outpatient elective cholecystectomy. Patient and her family members voiced understanding and we will discuss the options overnight. We will revisit the morning to decide the course of action.     Time spent with the patient was 80 minutes, with more than 50% of the time spent in face-to-face education, counseling and care coordination.     Ricarda Frame, MD FACS General Surgeon 07/23/2016, 3:59 PM

## 2016-07-23 NOTE — Progress Notes (Signed)
[facility] Physicians - [place] at [facility]   PATIENT NAME: Alexandra Thompson    MR#:  [id number]  DATE OF BIRTH:  [birth year]  SUBJECTIVE: Admitted for acute pancreatitis secondary to gallstones. I placed 283 yesterday but 702 today but patient feels better, last pain medicine was last night. Tolerating the liquid diet. LFTs are improving. No nausea, no abdominal pain.   CHIEF COMPLAINT:   Chief Complaint  Patient presents with  . Abdominal Pain    REVIEW OF SYSTEMS:   ROS CONSTITUTIONAL: No fever, fatigue or weakness.  EYES: No blurred or double vision.  EARS, NOSE, AND THROAT: No tinnitus or ear pain.  RESPIRATORY: No cough, shortness of breath, wheezing or hemoptysis.  CARDIOVASCULAR: No chest pain, orthopnea, edema.  GASTROINTESTINAL: No nausea, vomiting, diarrhea or abdominal pain.  GENITOURINARY: No dysuria, hematuria.  ENDOCRINE: No polyuria, nocturia,  HEMATOLOGY: No anemia, easy bruising or bleeding SKIN: No rash or lesion. MUSCULOSKELETAL: No joint pain or arthritis.   NEUROLOGIC: No tingling, numbness, weakness.  PSYCHIATRY: No anxiety or depression.   DRUG ALLERGIES:  No Known Allergies  VITALS:  Blood pressure (!) 104/56, pulse 69, temperature 98.3 F (36.8 C), temperature source Oral, resp. rate 18, height  (1.448 m), weight 79.4 kg (175 lb), SpO2 99 %, unknown if currently breastfeeding.  PHYSICAL EXAMINATION:  GENERAL:  26 y.o.-year-old patient lying in the bed with no acute distress.  EYES: Pupils equal, round, reactive to light and accommodation. No scleral icterus. Extraocular muscles intact.  HEENT: Head atraumatic, normocephalic. Oropharynx and nasopharynx clear.  NECK:  Supple, no jugular venous distention. No thyroid enlargement, no tenderness.  LUNGS: Normal breath sounds bilaterally, no wheezing, rales,rhonchi or crepitation. No use of accessory muscles of respiration.  CARDIOVASCULAR: S1, S2 normal. No murmurs, rubs, or gallops.   ABDOMEN: Soft, nontender, nondistended. Bowel sounds present. No organomegaly or mass.  EXTREMITIES: No pedal edema, cyanosis, or clubbing.  NEUROLOGIC: Cranial nerves II through XII are intact. Muscle strength 5/5 in all extremities. Sensation intact. Gait not checked.  PSYCHIATRIC: The patient is alert and oriented x 3.  SKIN: No obvious rash, lesion, or ulcer.    LABORATORY PANEL:   CBC  Recent Labs Lab 07/23/16 0523  WBC 8.2  HGB 11.2*  HCT 33.8*  PLT 222   ------------------------------------------------------------------------------------------------------------------  Chemistries   Recent Labs Lab 07/23/16 0523  NA 139  K 3.2*  CL 108  CO2 26  GLUCOSE 110*  BUN 9  CREATININE 0.69  CALCIUM 8.4*  AST 131*  ALT 229*  ALKPHOS 205*  BILITOT 1.1   ------------------------------------------------------------------------------------------------------------------  Cardiac Enzymes No results for input(s): TROPONINI in the last 168 hours. ------------------------------------------------------------------------------------------------------------------  RADIOLOGY:  Ct Abdomen Pelvis W Contrast  Result Date: 07/22/2016 CLINICAL DATA:  Initial evaluation for acute epigastric abdominal pain, elevated lipase, concern for pancreatitis. EXAM: CT ABDOMEN AND PELVIS WITH CONTRAST TECHNIQUE: Multidetector CT imaging of the abdomen and pelvis was performed using the standard protocol following bolus administration of intravenous contrast. CONTRAST:  ISOVUE-300 IOPAMIDOL (ISOVUE-300) INJECTION 61% COMPARISON:  None. FINDINGS: Lower chest: Mild bibasilar atelectatic changes present dependently within the visualized lung bases. Visualized lungs are otherwise clear. Hepatobiliary: Liver demonstrates a normal contrast enhanced appearance. Layering stones and/or sludge present within the gallbladder lumen. No biliary dilatation. Pancreas: Hazy inflammatory stranding seen about the  pancreas, concerning for acute pancreatitis. No evidence for pancreatic necrosis. No loculated peripancreatic collections. Spleen: Spleen within normal limits. Adrenals/Urinary Tract: Adrenal glands are normal. Kidneys equal size  with symmetric enhancement. No nephrolithiasis, hydronephrosis, or focal enhancing renal mass. No hydroureter. Bladder within normal limits. Stomach/Bowel: Stomach within normal limits. Hazy inflammatory stranding surrounds the duodenum related to the inflammatory process within the adjacent pancreas. No evidence for bowel obstruction. Appendix is normal. No other acute inflammatory changes seen about the bowels. Vascular/Lymphatic: Normal intravascular enhancement seen throughout the intra-abdominal aorta and its branch vessels. Main portal vein and SMV appear patent. No pathologically enlarged intra-abdominal or pelvic lymph nodes identified. Reproductive: Uterus and ovaries within normal limits for age. Other: No free intraperitoneal air. Small volume scattered free fluid about the pancreas. Musculoskeletal: No acute osseous abnormality. No worrisome lytic or blastic osseous lesions. IMPRESSION: 1. Findings consistent with acute pancreatitis. No complication identified. 2. Layering hyperdensity within the gallbladder lumen, consistent with stones and/or sludge. No biliary dilatation. Electronically Signed   By: Rise Mu M.D.   On: 07/22/2016 21:25    EKG:  No orders found for this or any previous visit.  ASSESSMENT AND PLAN:   1. gallstone pancreatitis:  Continue  IV fluids, liquid diet, pain medicines, I discussed the case with Dr. Nelly Laurence) was going to see the patient. Continue liquid diet  Till  then, continue IV fluids, pain medicine, nausea medications.  D/w Husband.  All the records are reviewed and case discussed with Care Management/Social Workerr. Management plans discussed with the patient, family and they are in agreement.  CODE STATUS:  full  TOTAL TIME TAKING CARE OF THIS PATIENT: 35 minutes.   POSSIBLE D/C IN  1-2 DAYS, DEPENDING ON CLINICAL CONDITION.   Katha Hamming M.D on 07/23/2016 at 1:15 PM  Between 7am to 6pm - Pager - [phone]  After 6pm go to [email or web address] - password [email or web address] [facility]  [place]  Hospitalists  Office  [phone]  CC: Primary care physician; [facility] Department   Note: This dictation was prepared with Dragon dictation along with smaller phrase technology. Any transcriptional errors that result from this process are unintentional.

## 2016-07-24 LAB — COMPREHENSIVE METABOLIC PANEL
ALBUMIN: 3.3 g/dL — AB (ref 3.5–5.0)
ALK PHOS: 188 U/L — AB (ref 38–126)
ALT: 151 U/L — ABNORMAL HIGH (ref 14–54)
ANION GAP: 5 (ref 5–15)
AST: 40 U/L (ref 15–41)
BILIRUBIN TOTAL: 0.5 mg/dL (ref 0.3–1.2)
BUN: 6 mg/dL (ref 6–20)
CALCIUM: 8.9 mg/dL (ref 8.9–10.3)
CO2: 26 mmol/L (ref 22–32)
Chloride: 108 mmol/L (ref 101–111)
Creatinine, Ser: 0.67 mg/dL (ref 0.44–1.00)
GLUCOSE: 107 mg/dL — AB (ref 65–99)
POTASSIUM: 3.3 mmol/L — AB (ref 3.5–5.1)
Sodium: 139 mmol/L (ref 135–145)
TOTAL PROTEIN: 6.8 g/dL (ref 6.5–8.1)

## 2016-07-24 LAB — LIPASE, BLOOD: Lipase: 87 U/L — ABNORMAL HIGH (ref 11–51)

## 2016-07-24 MED ORDER — DEXTROSE 5 % IV SOLN
1.0000 g | INTRAVENOUS | Status: AC
  Administered 2016-07-25: 1 g via INTRAVENOUS
  Filled 2016-07-24: qty 1

## 2016-07-24 MED ORDER — POTASSIUM CHLORIDE CRYS ER 20 MEQ PO TBCR
40.0000 meq | EXTENDED_RELEASE_TABLET | Freq: Once | ORAL | Status: AC
  Administered 2016-07-24: 40 meq via ORAL
  Filled 2016-07-24: qty 2

## 2016-07-24 MED ORDER — CHLORHEXIDINE GLUCONATE CLOTH 2 % EX PADS
6.0000 | MEDICATED_PAD | Freq: Once | CUTANEOUS | Status: AC
  Administered 2016-07-24: 6 via TOPICAL

## 2016-07-24 MED ORDER — CHLORHEXIDINE GLUCONATE CLOTH 2 % EX PADS
6.0000 | MEDICATED_PAD | Freq: Once | CUTANEOUS | Status: AC
  Administered 2016-07-25: 6 via TOPICAL

## 2016-07-24 NOTE — Progress Notes (Signed)
CC: Gallstone pancreatitis Subjective: Patient reports feeling much better today. Her pain is gone. Her labs have normalized and she is tolerating a diet.  Objective: Vital signs in last 24 hours: Temp:  [98 F (36.7 C)-99.1 F (37.3 C)] 98 F (36.7 C) (04/05 1617) Pulse Rate:  [71-97] 82 (04/05 1617) Resp:  [16-20] 18 (04/05 1617) BP: (100-124)/(50-63) 122/61 (04/05 1617) SpO2:  [98 %-100 %] 98 % (04/05 1617) Last BM Date: 07/23/16  Intake/Output from previous day: 04/04 0701 - 04/05 0700 In: 240 [P.O.:240] Out: 1500 [Urine:1500] Intake/Output this shift: No intake/output data recorded.  Physical exam:  Gen.: No acute distress Chest: Clear to auscultation Heart: Regular rhythm Abdomen: Soft, nontender, nondistended  Lab Results: CBC   Recent Labs  07/22/16 1951 07/23/16 0523  WBC 13.2* 8.2  HGB 11.9* 11.2*  HCT 36.4 33.8*  PLT 223 222   BMET  Recent Labs  07/23/16 0523 07/24/16 0545  NA 139 139  K 3.2* 3.3*  CL 108 108  CO2 26 26  GLUCOSE 110* 107*  BUN 9 6  CREATININE 0.69 0.67  CALCIUM 8.4* 8.9   PT/INR No results for input(s): LABPROT, INR in the last 72 hours. ABG No results for input(s): PHART, HCO3 in the last 72 hours.  Invalid input(s): PCO2, PO2  Studies/Results: Ct Abdomen Pelvis W Contrast  Result Date: 07/22/2016 CLINICAL DATA:  Initial evaluation for acute epigastric abdominal pain, elevated lipase, concern for pancreatitis. EXAM: CT ABDOMEN AND PELVIS WITH CONTRAST TECHNIQUE: Multidetector CT imaging of the abdomen and pelvis was performed using the standard protocol following bolus administration of intravenous contrast. CONTRAST:  ISOVUE-300 IOPAMIDOL (ISOVUE-300) INJECTION 61% COMPARISON:  None. FINDINGS: Lower chest: Mild bibasilar atelectatic changes present dependently within the visualized lung bases. Visualized lungs are otherwise clear. Hepatobiliary: Liver demonstrates a normal contrast enhanced appearance. Layering  stones and/or sludge present within the gallbladder lumen. No biliary dilatation. Pancreas: Hazy inflammatory stranding seen about the pancreas, concerning for acute pancreatitis. No evidence for pancreatic necrosis. No loculated peripancreatic collections. Spleen: Spleen within normal limits. Adrenals/Urinary Tract: Adrenal glands are normal. Kidneys equal size with symmetric enhancement. No nephrolithiasis, hydronephrosis, or focal enhancing renal mass. No hydroureter. Bladder within normal limits. Stomach/Bowel: Stomach within normal limits. Hazy inflammatory stranding surrounds the duodenum related to the inflammatory process within the adjacent pancreas. No evidence for bowel obstruction. Appendix is normal. No other acute inflammatory changes seen about the bowels. Vascular/Lymphatic: Normal intravascular enhancement seen throughout the intra-abdominal aorta and its branch vessels. Main portal vein and SMV appear patent. No pathologically enlarged intra-abdominal or pelvic lymph nodes identified. Reproductive: Uterus and ovaries within normal limits for age. Other: No free intraperitoneal air. Small volume scattered free fluid about the pancreas. Musculoskeletal: No acute osseous abnormality. No worrisome lytic or blastic osseous lesions. IMPRESSION: 1. Findings consistent with acute pancreatitis. No complication identified. 2. Layering hyperdensity within the gallbladder lumen, consistent with stones and/or sludge. No biliary dilatation. Electronically Signed   By: Rise Mu M.D.   On: 07/22/2016 21:25    Anti-infectives: Anti-infectives    None      Assessment/Plan:  26 year old female with resolved gallstone pancreatitis. I discussed the procedure of laparoscopic cholecystectomy in detail.  The patient was given Agricultural engineer.  We discussed the risks and benefits of a laparoscopic cholecystectomy and possible cholangiogram including, but not limited to bleeding, infection,  injury to surrounding structures such as the intestine or liver, bile leak, retained gallstones, need to convert to an open procedure,  prolonged diarrhea, blood clots such as  DVT, common bile duct injury, anesthesia risks, and possible need for additional procedures.  The likelihood of improvement in symptoms and return to the patient's normal status is good. We discussed the typical post-operative recovery course. Patient voiced understanding and desires to proceed. She is scheduled for surgery tomorrow morning.   Cosme Jacob T. Tonita Cong, MD, [facility] General Surgeon [facility]  [facility] [phone] Night [facility] [phone] 07/24/2016

## 2016-07-24 NOTE — Progress Notes (Signed)
[facility] Physicians - [place] at [facility]   PATIENT NAME: Alexandra Thompson    MR#:  [id number]  DATE OF BIRTH:  Aug 30, 1990  SUBJECTIVE:   CHIEF COMPLAINT:   Chief Complaint  Patient presents with  . Abdominal Pain   Minimal abdominal pain and tolerating diet. Husband and baby at bedside. Afebrile  REVIEW OF SYSTEMS:   ROS CONSTITUTIONAL: No fever, fatigue or weakness.  EYES: No blurred or double vision.  EARS, NOSE, AND THROAT: No tinnitus or ear pain.  RESPIRATORY: No cough, shortness of breath, wheezing or hemoptysis.  CARDIOVASCULAR: No chest pain, orthopnea, edema.  GASTROINTESTINAL: No nausea, vomiting, diarrhea. Mild pain  GENITOURINARY: No dysuria, hematuria.  ENDOCRINE: No polyuria, nocturia,  HEMATOLOGY: No anemia, easy bruising or bleeding SKIN: No rash or lesion. MUSCULOSKELETAL: No joint pain or arthritis.   NEUROLOGIC: No tingling, numbness, weakness.  PSYCHIATRY: No anxiety or depression.   DRUG ALLERGIES:  No Known Allergies  VITALS:  Blood pressure 100/60, pulse 78, temperature 98.7 F (37.1 C), temperature source Oral, resp. rate 18, height  (1.448 m), weight 79.4 kg (175 lb), SpO2 100 %, unknown if currently breastfeeding.  PHYSICAL EXAMINATION:  GENERAL:  26 y.o.-year-old patient lying in the bed with no acute distress.  EYES: Pupils equal, round, reactive to light and accommodation. No scleral icterus. Extraocular muscles intact.  HEENT: Head atraumatic, normocephalic. Oropharynx and nasopharynx clear.  NECK:  Supple, no jugular venous distention. No thyroid enlargement, no tenderness.  LUNGS: Normal breath sounds bilaterally, no wheezing, rales,rhonchi or crepitation. No use of accessory muscles of respiration.  CARDIOVASCULAR: S1, S2 normal. No murmurs, rubs, or gallops.  ABDOMEN: Soft, nondistended. Bowel sounds present. No organomegaly or mass. Epigastric and LUQ tenderness EXTREMITIES: No pedal edema, cyanosis, or  clubbing.  NEUROLOGIC: Cranial nerves II through XII are intact. Muscle strength 5/5 in all extremities. Sensation intact. Gait not checked.  PSYCHIATRIC: The patient is alert and oriented x 3.  SKIN: No obvious rash, lesion, or ulcer.    LABORATORY PANEL:   CBC  Recent Labs Lab 07/23/16 0523  WBC 8.2  HGB 11.2*  HCT 33.8*  PLT 222   ------------------------------------------------------------------------------------------------------------------  Chemistries   Recent Labs Lab 07/24/16 0545  NA 139  K 3.3*  CL 108  CO2 26  GLUCOSE 107*  BUN 6  CREATININE 0.67  CALCIUM 8.9  AST 40  ALT 151*  ALKPHOS 188*  BILITOT 0.5   ------------------------------------------------------------------------------------------------------------------  Cardiac Enzymes No results for input(s): TROPONINI in the last 168 hours. ------------------------------------------------------------------------------------------------------------------  RADIOLOGY:  Ct Abdomen Pelvis W Contrast  Result Date: 07/22/2016 CLINICAL DATA:  Initial evaluation for acute epigastric abdominal pain, elevated lipase, concern for pancreatitis. EXAM: CT ABDOMEN AND PELVIS WITH CONTRAST TECHNIQUE: Multidetector CT imaging of the abdomen and pelvis was performed using the standard protocol following bolus administration of intravenous contrast. CONTRAST:  ISOVUE-300 IOPAMIDOL (ISOVUE-300) INJECTION 61% COMPARISON:  None. FINDINGS: Lower chest: Mild bibasilar atelectatic changes present dependently within the visualized lung bases. Visualized lungs are otherwise clear. Hepatobiliary: Liver demonstrates a normal contrast enhanced appearance. Layering stones and/or sludge present within the gallbladder lumen. No biliary dilatation. Pancreas: Hazy inflammatory stranding seen about the pancreas, concerning for acute pancreatitis. No evidence for pancreatic necrosis. No loculated peripancreatic collections. Spleen: Spleen  within normal limits. Adrenals/Urinary Tract: Adrenal glands are normal. Kidneys equal size with symmetric enhancement. No nephrolithiasis, hydronephrosis, or focal enhancing renal mass. No hydroureter. Bladder within normal limits. Stomach/Bowel: Stomach within normal limits. Hazy inflammatory  stranding surrounds the duodenum related to the inflammatory process within the adjacent pancreas. No evidence for bowel obstruction. Appendix is normal. No other acute inflammatory changes seen about the bowels. Vascular/Lymphatic: Normal intravascular enhancement seen throughout the intra-abdominal aorta and its branch vessels. Main portal vein and SMV appear patent. No pathologically enlarged intra-abdominal or pelvic lymph nodes identified. Reproductive: Uterus and ovaries within normal limits for age. Other: No free intraperitoneal air. Small volume scattered free fluid about the pancreas. Musculoskeletal: No acute osseous abnormality. No worrisome lytic or blastic osseous lesions. IMPRESSION: 1. Findings consistent with acute pancreatitis. No complication identified. 2. Layering hyperdensity within the gallbladder lumen, consistent with stones and/or sludge. No biliary dilatation. Electronically Signed   By: Rise Mu M.D.   On: 07/22/2016 21:25    EKG:  No orders found for this or any previous visit.  ASSESSMENT AND PLAN:   1. Gallstone pancreatitis:  Continue  IV fluids, diet, pain medicines. CBD normal on CT.  Will need cholecystectomy and scheduled for tomorrow  All the records are reviewed and case discussed with Care Management/Social Workerr. Management plans discussed with the patient, family and they are in agreement.  CODE STATUS: full  TOTAL TIME TAKING CARE OF THIS PATIENT: 35 minutes.   POSSIBLE D/C IN  1-2 DAYS, DEPENDING ON CLINICAL CONDITION.   Milagros Loll R M.D on 07/24/2016 at 12:37 PM  Between 7am to 6pm - Pager - [phone]  After 6pm go to [email or web address] -  password [email or web address] [facility]  [place] Hospitalists  Office  [phone]  CC: Primary care physician; [facility] Department   Note: This dictation was prepared with Dragon dictation along with smaller phrase technology. Any transcriptional errors that result from this process are unintentional.

## 2016-07-25 ENCOUNTER — Inpatient Hospital Stay: Payer: Medicaid Other | Admitting: Anesthesiology

## 2016-07-25 ENCOUNTER — Encounter: Payer: Self-pay | Admitting: Anesthesiology

## 2016-07-25 ENCOUNTER — Encounter
Admission: EM | Disposition: A | Discharge status: Home / Self Care | Payer: Self-pay | Source: Home / Self Care | Attending: Internal Medicine

## 2016-07-25 ENCOUNTER — Inpatient Hospital Stay: Payer: Medicaid Other

## 2016-07-25 HISTORY — PX: CHOLECYSTECTOMY: SHX55

## 2016-07-25 SURGERY — LAPAROSCOPIC CHOLECYSTECTOMY WITH INTRAOPERATIVE CHOLANGIOGRAM
Anesthesia: General | Wound class: Clean Contaminated

## 2016-07-25 MED ORDER — MIDAZOLAM HCL 2 MG/2ML IJ SOLN
INTRAMUSCULAR | Status: AC
  Filled 2016-07-25: qty 2

## 2016-07-25 MED ORDER — PROPOFOL 10 MG/ML IV BOLUS
INTRAVENOUS | Status: AC
  Filled 2016-07-25: qty 20

## 2016-07-25 MED ORDER — LIDOCAINE HCL 1 % IJ SOLN: INTRAMUSCULAR | Status: DC | PRN

## 2016-07-25 MED ORDER — LIDOCAINE HCL (PF) 1 % IJ SOLN
INTRAMUSCULAR | Status: AC
  Filled 2016-07-25: qty 30

## 2016-07-25 MED ORDER — FENTANYL CITRATE (PF) 100 MCG/2ML IJ SOLN
INTRAMUSCULAR | Status: AC
  Filled 2016-07-25: qty 2

## 2016-07-25 MED ORDER — DEXAMETHASONE SODIUM PHOSPHATE 10 MG/ML IJ SOLN
INTRAMUSCULAR | Status: AC
  Filled 2016-07-25: qty 1

## 2016-07-25 MED ORDER — ROCURONIUM BROMIDE 100 MG/10ML IV SOLN
INTRAVENOUS | Status: DC | PRN
  Administered 2016-07-25: 40 mg via INTRAVENOUS

## 2016-07-25 MED ORDER — FENTANYL CITRATE (PF) 100 MCG/2ML IJ SOLN
INTRAMUSCULAR | Status: DC | PRN
  Administered 2016-07-25 (×2): 50 ug via INTRAVENOUS

## 2016-07-25 MED ORDER — ACETAMINOPHEN 10 MG/ML IV SOLN
INTRAVENOUS | Status: DC | PRN
  Administered 2016-07-25: 1000 mg via INTRAVENOUS

## 2016-07-25 MED ORDER — PROPOFOL 10 MG/ML IV BOLUS
INTRAVENOUS | Status: DC | PRN
  Administered 2016-07-25: 150 mg via INTRAVENOUS

## 2016-07-25 MED ORDER — FENTANYL CITRATE (PF) 100 MCG/2ML IJ SOLN
25.0000 ug | INTRAMUSCULAR | Status: DC | PRN
  Administered 2016-07-25 (×2): 25 ug via INTRAVENOUS

## 2016-07-25 MED ORDER — LACTATED RINGERS IV SOLN
INTRAVENOUS | Status: DC | PRN
  Administered 2016-07-25: 09:00:00 via INTRAVENOUS

## 2016-07-25 MED ORDER — DEXAMETHASONE SODIUM PHOSPHATE 10 MG/ML IJ SOLN
INTRAMUSCULAR | Status: DC | PRN
  Administered 2016-07-25: 10 mg via INTRAVENOUS

## 2016-07-25 MED ORDER — OXYCODONE-ACETAMINOPHEN 5-325 MG PO TABS: 1.0000 | ORAL_TABLET | Freq: Four times a day (QID) | ORAL | 0 refills | Status: DC | PRN

## 2016-07-25 MED ORDER — PHENYLEPHRINE HCL 10 MG/ML IJ SOLN
INTRAMUSCULAR | Status: DC | PRN
  Administered 2016-07-25 (×2): 100 ug via INTRAVENOUS

## 2016-07-25 MED ORDER — LIDOCAINE HCL (CARDIAC) 20 MG/ML IV SOLN
INTRAVENOUS | Status: DC | PRN
  Administered 2016-07-25: 80 mg via INTRAVENOUS

## 2016-07-25 MED ORDER — OXYCODONE HCL 5 MG/5ML PO SOLN: 5.0000 mg | Freq: Once | ORAL | Status: DC | PRN

## 2016-07-25 MED ORDER — OXYCODONE-ACETAMINOPHEN 5-325 MG PO TABS
1.0000 | ORAL_TABLET | ORAL | Status: DC | PRN
  Administered 2016-07-25: 1 via ORAL
  Filled 2016-07-25: qty 2

## 2016-07-25 MED ORDER — FENTANYL CITRATE (PF) 100 MCG/2ML IJ SOLN
INTRAMUSCULAR | Status: AC
  Administered 2016-07-25: 25 ug via INTRAVENOUS
  Filled 2016-07-25: qty 2

## 2016-07-25 MED ORDER — ONDANSETRON HCL 4 MG/2ML IJ SOLN
INTRAMUSCULAR | Status: AC
  Filled 2016-07-25: qty 2

## 2016-07-25 MED ORDER — BUPIVACAINE HCL (PF) 0.5 % IJ SOLN
INTRAMUSCULAR | Status: AC
  Filled 2016-07-25: qty 30

## 2016-07-25 MED ORDER — SUGAMMADEX SODIUM 200 MG/2ML IV SOLN
INTRAVENOUS | Status: AC
  Filled 2016-07-25: qty 2

## 2016-07-25 MED ORDER — LIDOCAINE HCL (PF) 2 % IJ SOLN
INTRAMUSCULAR | Status: AC
  Filled 2016-07-25: qty 2

## 2016-07-25 MED ORDER — IOTHALAMATE MEGLUMINE 60 % INJ SOLN
INTRAMUSCULAR | Status: DC | PRN
  Administered 2016-07-25: 50 mL

## 2016-07-25 MED ORDER — MIDAZOLAM HCL 2 MG/2ML IJ SOLN
INTRAMUSCULAR | Status: DC | PRN
  Administered 2016-07-25: 2 mg via INTRAVENOUS

## 2016-07-25 MED ORDER — ACETAMINOPHEN 10 MG/ML IV SOLN
INTRAVENOUS | Status: AC
  Filled 2016-07-25: qty 100

## 2016-07-25 MED ORDER — SUGAMMADEX SODIUM 200 MG/2ML IV SOLN
INTRAVENOUS | Status: DC | PRN
  Administered 2016-07-25: 200 mg via INTRAVENOUS

## 2016-07-25 MED ORDER — ROCURONIUM BROMIDE 50 MG/5ML IV SOLN
INTRAVENOUS | Status: AC
  Filled 2016-07-25: qty 1

## 2016-07-25 MED ORDER — OXYCODONE HCL 5 MG PO TABS: 5.0000 mg | ORAL_TABLET | Freq: Once | ORAL | Status: DC | PRN

## 2016-07-25 MED ORDER — ONDANSETRON HCL 4 MG/2ML IJ SOLN
INTRAMUSCULAR | Status: DC | PRN
  Administered 2016-07-25: 4 mg via INTRAVENOUS

## 2016-07-25 SURGICAL SUPPLY — 45 items
ADHESIVE MASTISOL STRL (MISCELLANEOUS) ×3 IMPLANT
APPLIER CLIP ROT 10 11.4 M/L (STAPLE) ×3
BAG COUNTER SPONGE EZ (MISCELLANEOUS) IMPLANT
BLADE SURG SZ11 CARB STEEL (BLADE) ×3 IMPLANT
CANISTER SUCT 1200ML W/VALVE (MISCELLANEOUS) ×3 IMPLANT
CATH CHOLANG 76X19 KUMAR (CATHETERS) ×3 IMPLANT
CHLORAPREP W/TINT 26ML (MISCELLANEOUS) ×3 IMPLANT
CLIP APPLIE ROT 10 11.4 M/L (STAPLE) ×1 IMPLANT
CLOSURE WOUND 1/2 X4 (GAUZE/BANDAGES/DRESSINGS) ×1
CONRAY 60ML FOR OR (MISCELLANEOUS) IMPLANT
COUNTER SPONGE BAG EZ (MISCELLANEOUS)
DRAPE SHEET LG 3/4 BI-LAMINATE (DRAPES) ×3 IMPLANT
DRSG TEGADERM 2-3/8X2-3/4 SM (GAUZE/BANDAGES/DRESSINGS) ×12 IMPLANT
DRSG TELFA 4X3 1S NADH ST (GAUZE/BANDAGES/DRESSINGS) ×3 IMPLANT
ELECT REM PT RETURN 9FT ADLT (ELECTROSURGICAL) ×3
ELECTRODE REM PT RTRN 9FT ADLT (ELECTROSURGICAL) ×1 IMPLANT
GLOVE BIO SURGEON STRL SZ7.5 (GLOVE) ×12 IMPLANT
GLOVE BIOGEL PI IND STRL 7.0 (GLOVE) ×3 IMPLANT
GLOVE BIOGEL PI INDICATOR 7.0 (GLOVE) ×6
GLOVE INDICATOR 8.0 STRL GRN (GLOVE) ×9 IMPLANT
GOWN STRL REUS W/ TWL LRG LVL3 (GOWN DISPOSABLE) ×3 IMPLANT
GOWN STRL REUS W/TWL LRG LVL3 (GOWN DISPOSABLE) ×6
GRASPER SUT TROCAR 14GX15 (MISCELLANEOUS) ×3 IMPLANT
IRRIGATION STRYKERFLOW (MISCELLANEOUS) ×1 IMPLANT
IRRIGATOR STRYKERFLOW (MISCELLANEOUS) ×3
IV NS 1000ML (IV SOLUTION) ×2
IV NS 1000ML BAXH (IV SOLUTION) ×1 IMPLANT
L-HOOK LAP DISP 36CM (ELECTROSURGICAL) ×3
LABEL OR SOLS (LABEL) ×3 IMPLANT
LHOOK LAP DISP 36CM (ELECTROSURGICAL) ×1 IMPLANT
NEEDLE HYPO 25X1 1.5 SAFETY (NEEDLE) ×3 IMPLANT
NEEDLE VERESS 14GA 120MM (NEEDLE) ×3 IMPLANT
NS IRRIG 500ML POUR BTL (IV SOLUTION) ×3 IMPLANT
PACK LAP CHOLECYSTECTOMY (MISCELLANEOUS) ×3 IMPLANT
PENCIL ELECTRO HAND CTR (MISCELLANEOUS) ×3 IMPLANT
POUCH ENDO CATCH 10MM SPEC (MISCELLANEOUS) ×3 IMPLANT
SCISSORS METZENBAUM CVD 33 (INSTRUMENTS) ×3 IMPLANT
SLEEVE ENDOPATH XCEL 5M (ENDOMECHANICALS) ×6 IMPLANT
STRIP CLOSURE SKIN 1/2X4 (GAUZE/BANDAGES/DRESSINGS) ×2 IMPLANT
SUT MNCRL 4-0 (SUTURE) ×2
SUT MNCRL 4-0 27XMFL (SUTURE) ×1
SUTURE MNCRL 4-0 27XMF (SUTURE) ×1 IMPLANT
TROCAR XCEL 12X100 BLDLESS (ENDOMECHANICALS) ×3 IMPLANT
TROCAR XCEL NON-BLD 5MMX100MML (ENDOMECHANICALS) ×3 IMPLANT
TUBING INSUFFLATOR HI FLOW (MISCELLANEOUS) ×3 IMPLANT

## 2016-07-25 NOTE — Anesthesia Preprocedure Evaluation (Signed)
Anesthesia Evaluation  Patient identified by MRN, date of birth, ID band Patient awake    Reviewed: Allergy & Precautions, H&P , NPO status , Patient's Chart, lab work & pertinent test results  History of Anesthesia Complications Negative for: history of anesthetic complications  Airway Mallampati: II  TM Distance: >3 FB Neck ROM: full    Dental  (+) Poor Dentition, Chipped   Pulmonary neg pulmonary ROS, neg shortness of breath,    Pulmonary exam normal breath sounds clear to auscultation       Cardiovascular Exercise Tolerance: Good (-) angina(-) Past MI and (-) DOE negative cardio ROS Normal cardiovascular exam Rhythm:regular Rate:Normal     Neuro/Psych negative neurological ROS  negative psych ROS   GI/Hepatic negative GI ROS, Neg liver ROS, neg GERD  ,  Endo/Other  negative endocrine ROS  Renal/GU      Musculoskeletal   Abdominal   Peds  Hematology negative hematology ROS (+)   Anesthesia Other Findings Resolved pancreatis  Past Surgical History: No date: CESAREAN SECTION 06/09/2016: CESAREAN SECTION WITH BILATERAL TUBAL LIGATION Bilateral     Comment: Procedure: CESAREAN SECTION WITH BILATERAL               TUBAL LIGATION;  Surgeon: Suzy Bouchard, MD;  Location: [facility] ORS;                Service: Obstetrics;  Laterality: Bilateral;                Baby Boy @ 1032 Apgars: 9/9 Weight: 9lb 15oz 06/09/2016: OOPHORECTOMY Left     Comment: Procedure: OOPHORECTOMY;  Surgeon: Suzy Bouchard, MD;  Location: [facility] ORS;                Service: Gynecology;  Laterality: Left;  BMI    Body Mass Index:  37.87 kg/m      Reproductive/Obstetrics (+) Breast feeding                              Anesthesia Physical Anesthesia Plan  ASA: II  Anesthesia Plan: General ETT   Post-op Pain Management:    Induction:   Airway Management Planned:    Additional Equipment:   Intra-op Plan:   Post-operative Plan:   Informed Consent: I have reviewed the patients History and Physical, chart, labs and discussed the procedure including the risks, benefits and alternatives for the proposed anesthesia with the patient or authorized representative who has indicated his/her understanding and acceptance.   Dental Advisory Given  Plan Discussed with: Anesthesiologist, CRNA and Surgeon  Anesthesia Plan Comments:         Anesthesia Quick Evaluation

## 2016-07-25 NOTE — Anesthesia Postprocedure Evaluation (Signed)
Anesthesia Post Note  Patient: Alexandra Thompson  Procedure(s) Performed: Procedure(s) (LRB): LAPAROSCOPIC CHOLECYSTECTOMY WITH INTRAOPERATIVE CHOLANGIOGRAM (N/A)  Patient location during evaluation: PACU Anesthesia Type: General Level of consciousness: awake and alert Pain management: pain level controlled Vital Signs Assessment: post-procedure vital signs reviewed and stable Respiratory status: spontaneous breathing, nonlabored ventilation, respiratory function stable and patient connected to nasal cannula oxygen Cardiovascular status: blood pressure returned to baseline and stable Postop Assessment: no signs of nausea or vomiting Anesthetic complications: no     Last Vitals:  Vitals:   07/25/16 1136 07/25/16 1150  BP: (!) 100/59 (!) 100/46  Pulse: 70 65  Resp: (!) 24 17  Temp: 36.8 C 36.7 C    Last Pain:  Vitals:   07/25/16 1202  TempSrc:   PainSc: 5                  Cleda Mccreedy [name]

## 2016-07-25 NOTE — Anesthesia Post-op Follow-up Note (Cosign Needed)
Anesthesia QCDR form completed.        

## 2016-07-25 NOTE — Anesthesia Procedure Notes (Signed)
Procedure Name: Intubation Date/Time: 07/25/2016 9:23 AM Performed by: Doreen Salvage Pre-anesthesia Checklist: Patient identified, Patient being monitored, Timeout performed, Emergency Drugs available and Suction available Patient Re-evaluated:Patient Re-evaluated prior to inductionOxygen Delivery Method: Circle system utilized Preoxygenation: Pre-oxygenation with 100% oxygen Intubation Type: IV induction Ventilation: Mask ventilation without difficulty Laryngoscope Size: Mac and 3 Grade View: Grade I Tube type: Oral Tube size: 7.0 mm Number of attempts: 1 Airway Equipment and Method: Stylet Placement Confirmation: ETT inserted through vocal cords under direct vision,  positive ETCO2 and breath sounds checked- equal and bilateral Secured at: 21 cm Tube secured with: Tape Dental Injury: Teeth and Oropharynx as per pre-operative assessment

## 2016-07-25 NOTE — Transfer of Care (Signed)
Immediate Anesthesia Transfer of Care Note  Patient: Alexandra Thompson  Procedure(s) Performed: Procedure(s): LAPAROSCOPIC CHOLECYSTECTOMY WITH INTRAOPERATIVE CHOLANGIOGRAM (N/A)  Patient Location: PACU  Anesthesia Type:General  Level of Consciousness: sedated  Airway & Oxygen Therapy: Patient Spontanous Breathing and Patient connected to face mask oxygen  Post-op Assessment: Report given to RN and Post -op Vital signs reviewed and stable  Post vital signs: Reviewed and stable  Last Vitals:  Vitals:   07/25/16 0839 07/25/16 1051  BP: 105/60 103/61  Pulse: 67 64  Resp: 16 15  Temp: 36.1 C 36.7 C    Complications: No apparent anesthesia complications

## 2016-07-25 NOTE — Progress Notes (Signed)
Patient discharged home with significant other. Discharge instructions, prescriptions and follow up appointment given to and reviewed with patient and significant other. Patient verbalized understanding. Escorted out via wheelchair by Hilliard Clark, Nursing Student.

## 2016-07-25 NOTE — Op Note (Signed)
Laparoscopic Cholecystectomy  Pre-operative Diagnosis: Gallstone Pancreatitis  Post-operative Diagnosis: Same  Procedure: Laparoscopic Cholecystectomy with IOC  Surgeon: Leonette Most T. Tonita Cong, MD FACS  Anesthesia: Gen. with endotracheal tube  Assistant:PA Student  Procedure Details  The patient was seen again in the Holding Room. The benefits, complications, treatment options, and expected outcomes were discussed with the patient. The risks of bleeding, infection, recurrence of symptoms, failure to resolve symptoms, bile duct damage, bile duct leak, retained common bile duct stone, bowel injury, any of which could require further surgery and/or ERCP, stent, or papillotomy were reviewed with the patient. The likelihood of improving the patient's symptoms with return to their baseline status is good.  The patient and/or family concurred with the proposed plan, giving informed consent.  The patient was taken to Operating Room, identified as Pete Pelt and the procedure verified as Laparoscopic Cholecystectomy.  A Time Out was held and the above information confirmed.  Prior to the induction of general anesthesia, antibiotic prophylaxis was administered. VTE prophylaxis was in place. General endotracheal anesthesia was then administered and tolerated well. After the induction, the abdomen was prepped with Chloraprep and draped in the sterile fashion. The patient was positioned in the supine position.  Local anesthetic  was injected into the skin near the umbilicus and an incision made. The Veress needle was placed. Pneumoperitoneum was then created with CO2 and tolerated well without any adverse changes in the patient's vital signs. A 5mm port was placed in the periumbilical position and the abdominal cavity was explored.  Two 5-mm ports were placed in the right upper quadrant and a 12 mm epigastric port was placed all under direct vision. All skin incisions  were infiltrated with a local anesthetic  agent before making the incision and placing the trocars.   The patient was positioned  in reverse Trendelenburg, tilted slightly to the patient's left.  The gallbladder was identified, the fundus grasped and retracted cephalad. Adhesions were lysed bluntly. The infundibulum was grasped and retracted laterally, exposing the peritoneum overlying the triangle of Calot. This was then divided and exposed in a blunt fashion. A critical view of the cystic duct and cystic artery was obtained.  The cystic duct was clearly identified and bluntly dissected.   At this point a cholangiogram was attempted using a Kumar clamp and catheter. With the mid body of the gallbladder clamped the catheter was inserted into the gallbladder without any difficulty with immediate return of bile. Under fluoroscopy numerous attempts to perform a clamp gram were performed. Contrast never exited the gallbladder. Attempts at manipulation and change of angles continued until there was spillage of contrast into the peri-outside of the duct. There was some visualization of the cystic duct but without completion of the clamp gram. We aborted the clamp gram at this point and proceeded with the cholecystectomy. Again the critical view was obtained and the cystic duct and artery were easily clipped. Serial clips with adduction artery were performed with a flare that were cut between endoscopic shears.  The gallbladder was taken from the gallbladder fossa in a retrograde fashion with the electrocautery. The gallbladder was removed and placed in an Endocatch bag. The liver bed was irrigated and inspected. Hemostasis was achieved with the electrocautery. Copious irrigation was utilized and was repeatedly aspirated until clear.  The gallbladder and Endocatch sac were then removed through the epigastric port site.   Inspection of the right upper quadrant was performed. No bleeding, bile duct injury or leak, or bowel injury was  noted. Pneumoperitoneum  was released. 4-0 subcuticular Monocryl was used to close the skin. Steristrips and Mastisol and sterile dressings were  applied.  The patient was then extubated and brought to the recovery room in stable condition. Sponge, lap, and needle counts were correct at closure and at the conclusion of the case.   Findings: Gallstone pancreatitis  Estimated Blood Loss: 10 mL         Drains: None         Specimens: Gallbladder           Complications: none               Panayiota Larkin T. Tonita Cong, MD, FACS

## 2016-07-25 NOTE — Brief Op Note (Signed)
07/22/2016 - 07/25/2016  10:41 AM  PATIENT:  Alexandra Thompson  26 y.o. female  PRE-OPERATIVE DIAGNOSIS:  Gallstone Pancreatitis  POST-OPERATIVE DIAGNOSIS:  cholelcystitis  PROCEDURE:  Procedure(s): LAPAROSCOPIC CHOLECYSTECTOMY WITH INTRAOPERATIVE CHOLANGIOGRAM (N/A)  SURGEON:  Surgeon(s) and Role:    * Ricarda Frame, MD - Primary  PHYSICIAN ASSISTANT:   ASSISTANTS: PA STUDENT   ANESTHESIA:   general  EBL:  Total I/O In: -  Out: 150 [Urine:150]  BLOOD ADMINISTERED:none  DRAINS: none   LOCAL MEDICATIONS USED:  MARCAINE   , XYLOCAINE  and Amount: 20 ml  SPECIMEN:  Source of Specimen:  gallbladder  DISPOSITION OF SPECIMEN:  PATHOLOGY  COUNTS:  YES  TOURNIQUET:  * No tourniquets in log *  DICTATION: .Dragon Dictation  PLAN OF CARE: return to inpatient   PATIENT DISPOSITION:  PACU - hemodynamically stable.   Delay start of Pharmacological VTE agent (>24hrs) due to surgical blood loss or risk of bleeding: no

## 2016-07-26 NOTE — Discharge Summary (Signed)
[employer] - [place] [facility]   PATIENT NAME: Alexandra Thompson    MR#:  [id number]  DATE OF BIRTH:  [birth year]  DATE OF ADMISSION:  07/22/2016 ADMITTING PHYSICIAN: Milagros Loll, MD  DATE OF DISCHARGE: 07/25/2016  6:18 PM  PRIMARY CARE PHYSICIAN: [facility]   ADMISSION DIAGNOSIS:  Epigastric pain [R10.13] Elevated LFTs [R79.89] Acute pancreatitis without infection or necrosis, unspecified pancreatitis type [K85.90]  DISCHARGE DIAGNOSIS:  Active Problems:   Acute pancreatitis   SECONDARY DIAGNOSIS:   Past Medical History:  Diagnosis Date  . Medical history non-contributory      ADMITTING HISTORY   HISTORY OF PRESENT ILLNESS:  Alexandra Thompson  is a 26 y.o. female with a known history of Recent C-section presents to the emergency room complaining of acute onset of epigastric and left upper quadrant abdominal pain. Patient has had similar pain on and off which improved with Tums for the past 3 months. But since yesterday this is constant. Her lipase is elevated greater than 1800. Mild elevation in bilirubin and liver function tests. CT scan of the abdomen shows acute pancreatitis with gallstones or sludge. No cholecystitis.   HOSPITAL COURSE:   * Acute pancreatitis with gallstones. Patient's lipase returned to normal during her hospital stay with significant improvement in abdominal pain. Manage symptomatically. Surgery saw the patient and patient had lap cholecystectomy prior to discharge. She has tolerated soft diet prior to discharge. Surgical care instructions given by Dr. Tonita Cong. Follow up with him as outpatient in 1 week.  Stable for discharge home.  Pain medication prescription given.  CONSULTS OBTAINED:  Treatment Team:  Ricarda Frame, MD  DRUG ALLERGIES:  No Known Allergies  DISCHARGE MEDICATIONS:   Discharge Medication List as of 07/25/2016  5:48 PM    CONTINUE these medications which have CHANGED   Details   oxyCODONE-acetaminophen (PERCOCET/ROXICET) 5-325 MG tablet Take 1 tablet by mouth every 6 (six) hours as needed for severe pain (pain scale 4-7)., Starting Fri 07/25/2016, Print      CONTINUE these medications which have NOT CHANGED   Details  acetaminophen (TYLENOL) 325 MG tablet Take 325 mg by mouth every 6 (six) hours as needed for headache., Historical Med    ferrous sulfate 325 (65 FE) MG tablet Take 1 tablet (325 mg total) by mouth 2 (two) times daily with a meal., Starting Thu 06/12/2016, Normal    ibuprofen (ADVIL,MOTRIN) 600 MG tablet Take 1 tablet (600 mg total) by mouth every 6 (six) hours as needed., Starting Thu 06/12/2016, Print    Prenatal Vit-Fe Fumarate-FA (PRENATAL MULTIVITAMIN) TABS tablet Take 1 tablet by mouth daily at 12 noon., Historical Med    senna-docusate (SENOKOT-S) 8.6-50 MG tablet Take 2 tablets by mouth daily., Starting Fri 06/13/2016, Print        Today   VITAL SIGNS:  Blood pressure 115/69, pulse 60, temperature 97.6 F (36.4 C), temperature source Oral, resp. rate 16, height  (1.448 m), weight 79.4 kg (175 lb), SpO2 98 %, unknown if currently breastfeeding.  I/O:   Intake/Output Summary (Last 24 hours) at 07/26/16 1328 Last data filed at 07/25/16 1800  Gross per 24 hour  Intake                0 ml  Output              550 ml  Net             -550 ml    PHYSICAL EXAMINATION:  Physical Exam  GENERAL:  26 y.o.-year-old patient lying in the bed with no acute distress.  LUNGS: Normal breath sounds bilaterally, no wheezing, rales,rhonchi or crepitation. No use of accessory muscles of respiration.  CARDIOVASCULAR: S1, S2 normal. No murmurs, rubs, or gallops.  ABDOMEN: Soft, Mild tenderness around surgical site NEUROLOGIC: Moves all 4 extremities. PSYCHIATRIC: The patient is alert and oriented x 3.  SKIN: No obvious rash, lesion, or ulcer.   DATA REVIEW:   CBC  Recent Labs Lab 07/23/16 0523  WBC 8.2  HGB 11.2*  HCT 33.8*  PLT 222     Chemistries   Recent Labs Lab 07/24/16 0545  NA 139  K 3.3*  CL 108  CO2 26  GLUCOSE 107*  BUN 6  CREATININE 0.67  CALCIUM 8.9  AST 40  ALT 151*  ALKPHOS 188*  BILITOT 0.5    Cardiac Enzymes No results for input(s): TROPONINI in the last 168 hours.  Microbiology Results  Results for orders placed or performed during the hospital encounter of 06/09/16  OB RESULT CONSOLE Group B Strep     Status: None   Collection Time: 05/16/16 12:00 AM  Result Value Ref Range Status   GBS Positive  Final  Chlamydia/NGC rt PCR ([facility] only)     Status: None   Collection Time: 06/10/16  3:16 PM  Result Value Ref Range Status   Specimen source GC/Chlam URINE, RANDOM  Final   Chlamydia Tr NOT DETECTED NOT DETECTED Final   N gonorrhoeae NOT DETECTED NOT DETECTED Final    Comment: (NOTE) 100  This methodology has not been evaluated in pregnant women or in 200  patients with a history of hysterectomy. 300 400  This methodology will not be performed on patients less than 84  years of age.     RADIOLOGY:  Dg Cholangiogram Operative  Result Date: 07/25/2016 CLINICAL DATA:  Cholecystitis EXAM: INTRAOPERATIVE CHOLANGIOGRAM TECHNIQUE: Cholangiographic images from the C-arm fluoroscopic device were submitted for interpretation post-operatively. Please see the procedural report for the amount of contrast and the fluoroscopy time utilized. COMPARISON:  07/22/2016 FINDINGS: Intraoperative cholangiogram attempted during the laparoscopic procedure. Gallbladder is opacified with contrast. However, contrast does not exit the gallbladder. Cystic duct and biliary system are not visualized. There is extravasation in the right upper quadrant related to the attempt. Appearance compatible with cystic duct obstruction. IMPRESSION: Nonvisualization of the biliary system. Findings compatible with cystic duct obstruction. Electronically Signed   By: Judie Petit.  Shick M.D.   On: 07/25/2016 10:51    Follow up with PCP in  1 week.  Management plans discussed with the patient, family and they are in agreement.  CODE STATUS:  Code Status History    Date Active Date Inactive Code Status Order ID Comments User Context   07/22/2016  9:54 PM 07/25/2016  9:24 PM Full Code [id number]  Milagros Loll, MD ED   06/09/2016  3:43 PM 06/12/2016  3:57 PM Full Code [id number]  Suzy Bouchard, MD Inpatient      TOTAL TIME TAKING CARE OF THIS PATIENT ON DAY OF DISCHARGE: more than 30 minutes.   Milagros Loll R M.D on 07/26/2016 at 1:28 PM  Between 7am to 6pm - Pager - [phone]  After 6pm go to [email or web address] - password [email or web address] [facility]  [facility] [place] [facility]  Office  [phone]  CC: Primary care physician; [facility] Department  Note: This dictation was prepared with Dragon dictation along with smaller phrase technology. Any transcriptional errors that result from this  process are unintentional.

## 2016-07-28 ENCOUNTER — Telehealth: Payer: Self-pay | Admitting: General Surgery

## 2016-07-28 LAB — SURGICAL PATHOLOGY

## 2016-07-28 NOTE — Telephone Encounter (Signed)
Vomiting, Pain and dizziness since her surgery on 4/6

## 2016-07-29 ENCOUNTER — Emergency Department: Payer: Medicaid Other

## 2016-07-29 ENCOUNTER — Encounter: Payer: Self-pay | Admitting: Emergency Medicine

## 2016-07-29 ENCOUNTER — Inpatient Hospital Stay
Admission: EM | Admit: 2016-07-29 | Discharge: 2016-08-02 | DRG: 446 | Disposition: A | Discharge status: Home / Self Care | Payer: Medicaid Other | Attending: Surgery | Admitting: Surgery

## 2016-07-29 DIAGNOSIS — J189 Pneumonia, unspecified organism: Secondary | ICD-10-CM

## 2016-07-29 DIAGNOSIS — K833 Fistula of bile duct: Principal | ICD-10-CM | POA: Diagnosis present

## 2016-07-29 DIAGNOSIS — K838 Other specified diseases of biliary tract: Secondary | ICD-10-CM

## 2016-07-29 DIAGNOSIS — R1010 Upper abdominal pain, unspecified: Secondary | ICD-10-CM | POA: Diagnosis present

## 2016-07-29 DIAGNOSIS — Z6837 Body mass index (BMI) 37.0-37.9, adult: Secondary | ICD-10-CM

## 2016-07-29 DIAGNOSIS — K9189 Other postprocedural complications and disorders of digestive system: Secondary | ICD-10-CM

## 2016-07-29 DIAGNOSIS — Z79899 Other long term (current) drug therapy: Secondary | ICD-10-CM | POA: Diagnosis not present

## 2016-07-29 DIAGNOSIS — Z9049 Acquired absence of other specified parts of digestive tract: Secondary | ICD-10-CM | POA: Diagnosis not present

## 2016-07-29 DIAGNOSIS — R1011 Right upper quadrant pain: Secondary | ICD-10-CM

## 2016-07-29 DIAGNOSIS — R7981 Abnormal blood-gas level: Secondary | ICD-10-CM

## 2016-07-29 DIAGNOSIS — Z9889 Other specified postprocedural states: Secondary | ICD-10-CM

## 2016-07-29 LAB — COMPREHENSIVE METABOLIC PANEL
ALK PHOS: 370 U/L — AB (ref 38–126)
ALT: 284 U/L — ABNORMAL HIGH (ref 14–54)
ANION GAP: 9 (ref 5–15)
AST: 44 U/L — ABNORMAL HIGH (ref 15–41)
Albumin: 3.6 g/dL (ref 3.5–5.0)
BUN: 14 mg/dL (ref 6–20)
CALCIUM: 9 mg/dL (ref 8.9–10.3)
CO2: 23 mmol/L (ref 22–32)
Chloride: 104 mmol/L (ref 101–111)
Creatinine, Ser: 0.58 mg/dL (ref 0.44–1.00)
GFR calc non Af Amer: >60 mL/min (ref >60)
GLUCOSE: 111 mg/dL — AB (ref 65–99)
Potassium: 3.6 mmol/L (ref 3.5–5.1)
Sodium: 136 mmol/L (ref 135–145)
TOTAL PROTEIN: 7.4 g/dL (ref 6.5–8.1)
Total Bilirubin: 1.6 mg/dL — ABNORMAL HIGH (ref 0.3–1.2)

## 2016-07-29 LAB — CBC
HEMATOCRIT: 37.3 % (ref 35.0–47.0)
HEMOGLOBIN: 12.3 g/dL (ref 12.0–16.0)
MCH: 28.2 pg (ref 26.0–34.0)
MCHC: 33.1 g/dL (ref 32.0–36.0)
MCV: 85.1 fL (ref 80.0–100.0)
Platelets: 245 10*3/uL (ref 150–440)
RBC: 4.38 MIL/uL (ref 3.80–5.20)
RDW: 15 % — ABNORMAL HIGH (ref 11.5–14.5)
WBC: 11.7 10*3/uL — ABNORMAL HIGH (ref 3.6–11.0)

## 2016-07-29 LAB — LIPASE, BLOOD: Lipase: 19 U/L (ref 11–51)

## 2016-07-29 MED ORDER — ONDANSETRON HCL 4 MG PO TABS: 4.0000 mg | ORAL_TABLET | Freq: Four times a day (QID) | ORAL | Status: DC | PRN

## 2016-07-29 MED ORDER — MORPHINE SULFATE (PF) 2 MG/ML IV SOLN
2.0000 mg | INTRAVENOUS | Status: DC | PRN
  Administered 2016-07-29 – 2016-07-30 (×5): 2 mg via INTRAVENOUS
  Filled 2016-07-29 (×5): qty 1

## 2016-07-29 MED ORDER — ACETAMINOPHEN 325 MG PO TABS: 325.0000 mg | ORAL_TABLET | Freq: Four times a day (QID) | ORAL | Status: DC | PRN

## 2016-07-29 MED ORDER — MORPHINE SULFATE (PF) 4 MG/ML IV SOLN
4.0000 mg | Freq: Once | INTRAVENOUS | Status: AC
  Administered 2016-07-29: 4 mg via INTRAVENOUS
  Filled 2016-07-29: qty 1

## 2016-07-29 MED ORDER — SODIUM CHLORIDE 0.9 % IV SOLN
INTRAVENOUS | Status: AC
  Administered 2016-07-29: 3 g via INTRAVENOUS
  Filled 2016-07-29: qty 3

## 2016-07-29 MED ORDER — HEPARIN SODIUM (PORCINE) 5000 UNIT/ML IJ SOLN
5000.0000 [IU] | Freq: Three times a day (TID) | INTRAMUSCULAR | Status: DC
  Administered 2016-07-29 – 2016-08-02 (×10): 5000 [IU] via SUBCUTANEOUS
  Filled 2016-07-29 (×9): qty 1

## 2016-07-29 MED ORDER — IOPAMIDOL (ISOVUE-300) INJECTION 61%
100.0000 mL | Freq: Once | INTRAVENOUS | Status: AC | PRN
  Administered 2016-07-29: 100 mL via INTRAVENOUS

## 2016-07-29 MED ORDER — OXYCODONE-ACETAMINOPHEN 5-325 MG PO TABS
1.0000 | ORAL_TABLET | Freq: Four times a day (QID) | ORAL | Status: DC | PRN
  Administered 2016-07-29 (×2): 1 via ORAL
  Filled 2016-07-29 (×2): qty 1

## 2016-07-29 MED ORDER — ONDANSETRON HCL 4 MG/2ML IJ SOLN
4.0000 mg | Freq: Once | INTRAMUSCULAR | Status: AC
  Administered 2016-07-29: 4 mg via INTRAVENOUS

## 2016-07-29 MED ORDER — IOPAMIDOL (ISOVUE-300) INJECTION 61%
30.0000 mL | INTRAVENOUS | Status: DC
  Administered 2016-07-29: 30 mL via ORAL
  Administered 2016-07-29: 15 mL via ORAL

## 2016-07-29 MED ORDER — LACTATED RINGERS IV SOLN
INTRAVENOUS | Status: DC
  Administered 2016-07-29 – 2016-08-01 (×5): via INTRAVENOUS

## 2016-07-29 MED ORDER — ONDANSETRON HCL 4 MG/2ML IJ SOLN
4.0000 mg | Freq: Once | INTRAMUSCULAR | Status: AC
  Administered 2016-07-29: 4 mg via INTRAVENOUS
  Filled 2016-07-29: qty 2

## 2016-07-29 MED ORDER — SODIUM CHLORIDE 0.9 % IV SOLN
3.0000 g | Freq: Four times a day (QID) | INTRAVENOUS | Status: DC
  Administered 2016-07-29 – 2016-08-02 (×15): 3 g via INTRAVENOUS
  Filled 2016-07-29 (×18): qty 3

## 2016-07-29 MED ORDER — ONDANSETRON HCL 4 MG/2ML IJ SOLN
INTRAMUSCULAR | Status: AC
  Filled 2016-07-29: qty 2

## 2016-07-29 MED ORDER — TECHNETIUM TC 99M MEBROFENIN IV KIT
5.0000 | PACK | Freq: Once | INTRAVENOUS | Status: AC | PRN
Start: 2016-07-29 — End: 2016-07-29
  Administered 2016-07-29: 5.45 via INTRAVENOUS

## 2016-07-29 MED ORDER — ONDANSETRON HCL 4 MG/2ML IJ SOLN
4.0000 mg | Freq: Four times a day (QID) | INTRAMUSCULAR | Status: DC | PRN
  Administered 2016-07-29 – 2016-07-30 (×2): 4 mg via INTRAVENOUS
  Filled 2016-07-29 (×2): qty 2

## 2016-07-29 NOTE — ED Notes (Signed)
Patient transported to Nuclear Medicine at this time. 

## 2016-07-29 NOTE — H&P (Signed)
Alexandra Thompson is an 26 y.o. female.    Chief Complaint: Right upper quadrant pain  HPI: This patient several days status post laparoscopic cholecystectomy with attempted cholangiography for biliary pancreatitis by Dr. Adonis Huguenin. She was discharged and was feeling well but on Sunday she started having worsening pain she's nauseated and has had 1 emesis she denies fevers or chills denies jaundice or acholic stools.  Her operative report and prior laboratory values are been personally reviewed.  A workup in the emergency room after discussing with Dr. Corky Downs was that the patient likely has a bile leak and that workup has confirmed that.  Past Medical History:  Diagnosis Date  . Medical history non-contributory     Past Surgical History:  Procedure Laterality Date  . CESAREAN SECTION    . CESAREAN SECTION WITH BILATERAL TUBAL LIGATION Bilateral 06/09/2016   Procedure: CESAREAN SECTION WITH BILATERAL TUBAL LIGATION;  Surgeon: Boykin Nearing, MD;  Location: [facility] ORS;  Service: Obstetrics;  Laterality: Bilateral;  Baby Boy @ 1032 Apgars: 9/9 Weight: 9lb 15oz  . CHOLECYSTECTOMY N/A 07/25/2016   Procedure: LAPAROSCOPIC CHOLECYSTECTOMY WITH INTRAOPERATIVE CHOLANGIOGRAM;  Surgeon: Clayburn Pert, MD;  Location: [facility] ORS;  Service: General;  Laterality: N/A;  . OOPHORECTOMY Left 06/09/2016   Procedure: Morene Crocker;  Surgeon: Boykin Nearing, MD;  Location: [facility] ORS;  Service: Gynecology;  Laterality: Left;    No family history on file. Social History:  reports that she has never smoked. She has never used smokeless tobacco. She reports that she does not drink alcohol or use drugs.  Allergies: No Known Allergies   (Not in a hospital admission)   Review of Systems  Constitutional: Positive for malaise/fatigue. Negative for chills and fever.  HENT: Negative.   Eyes: Negative.   Respiratory: Negative.   Cardiovascular: Negative.   Gastrointestinal: Positive for abdominal pain,  nausea and vomiting. Negative for constipation, diarrhea and heartburn.  Genitourinary: Negative.   Musculoskeletal: Negative.   Skin: Negative.   Neurological: Negative.   Endo/Heme/Allergies: Negative.   Psychiatric/Behavioral: Negative.      Physical Exam:  BP 117/61   Pulse 63   Temp 98.6 F (37 C) (Oral)   Resp 20   Ht _0  (1.448 m)   Wt 175 lb (79.4 kg)   SpO2 97%   Breastfeeding? Yes Comment: Pt told to pump & dump for 48 hr post NM test  BMI 37.87 kg/m   Physical Exam  Constitutional: She is oriented to person, place, and time. She appears distressed.  HENT:  Head: Normocephalic and atraumatic.  Eyes: Pupils are equal, round, and reactive to light. Right eye exhibits no discharge. Left eye exhibits no discharge. No scleral icterus.  Neck: Normal range of motion.  Cardiovascular: Normal rate, regular rhythm and normal heart sounds.   Pulmonary/Chest: Effort normal and breath sounds normal. No respiratory distress. She has no wheezes.  Abdominal: Soft. She exhibits distension. There is tenderness. There is no rebound and no guarding.  Wounds clean without erythema or drainage tenderness in the right upper quadrant and right flank without ecchymosis  Musculoskeletal: Normal range of motion. She exhibits no edema.  Lymphadenopathy:    She has no cervical adenopathy.  Neurological: She is alert and oriented to person, place, and time.  Skin: Skin is warm. No rash noted. She is diaphoretic. No erythema.  Psychiatric: Mood and affect normal.  Vitals reviewed.       Results for orders placed or performed during the hospital encounter of 07/29/16 (from  the past 48 hour(s))  CBC     Status: Abnormal   Collection Time: 07/29/16  4:28 AM  Result Value Ref Range   WBC 11.7 (H) 3.6 - 11.0 K/uL   RBC 4.38 3.80 - 5.20 MIL/uL   Hemoglobin 12.3 12.0 - 16.0 g/dL   HCT 37.3 35.0 - 47.0 %   MCV 85.1 80.0 - 100.0 fL   MCH 28.2 26.0 - 34.0 pg   MCHC 33.1 32.0 - 36.0 g/dL    RDW 15.0 (H) 11.5 - 14.5 %   Platelets 245 150 - 440 K/uL  Comprehensive metabolic panel     Status: Abnormal   Collection Time: 07/29/16  4:28 AM  Result Value Ref Range   Sodium 136 135 - 145 mmol/L   Potassium 3.6 3.5 - 5.1 mmol/L   Chloride 104 101 - 111 mmol/L   CO2 23 22 - 32 mmol/L   Glucose, Bld 111 (H) 65 - 99 mg/dL   BUN 14 6 - 20 mg/dL   Creatinine, Ser 0.58 0.44 - 1.00 mg/dL   Calcium 9.0 8.9 - 10.3 mg/dL   Total Protein 7.4 6.5 - 8.1 g/dL   Albumin 3.6 3.5 - 5.0 g/dL   AST 44 (H) 15 - 41 U/L   ALT 284 (H) 14 - 54 U/L   Alkaline Phosphatase 370 (H) 38 - 126 U/L   Total Bilirubin 1.6 (H) 0.3 - 1.2 mg/dL   GFR calc non Af Amer >60 >60 mL/min   GFR calc Af Amer >60 >60 mL/min    Comment: (NOTE) The eGFR has been calculated using the CKD EPI equation. This calculation has not been validated in all clinical situations. eGFR's persistently <60 mL/min signify possible Chronic Kidney Disease.    Anion gap 9 5 - 15  Lipase, blood     Status: None   Collection Time: 07/29/16  4:28 AM  Result Value Ref Range   Lipase 19 11 - 51 U/L   Nm Hepatobiliary Liver Func  Result Date: 07/29/2016 CLINICAL DATA:  Severe upper abdominal pain, nausea and vomiting for 3 days. Status post laparoscopic cholecystectomy 07/25/2016. Perihepatic, right pericolic gutter and pelvic free fluid on CT study from earlier today. EXAM: NUCLEAR MEDICINE HEPATOBILIARY IMAGING TECHNIQUE: Sequential images of the abdomen were obtained out to 60 minutes following intravenous administration of radiopharmaceutical. RADIOPHARMACEUTICALS:  5.5 mCi Tc-82m Choletec IV COMPARISON:  07/29/2016 CT abdomen/pelvis. FINDINGS: Prompt uptake and biliary excretion of activity by the liver is seen. There is prompt excretion of radiotracer into the cholecystectomy bed, perihepatic space, right paracolic gutter and deep pelvis, compatible with bile leak. IMPRESSION: Hepatobiliary scintigraphy study is positive for bile leak  with significant radiotracer accumulation in the cholecystectomy bed, perihepatic space, right paracolic gutter and deep pelvis. Urgent surgical consultation advised. These results were called by telephone at the time of interpretation on 07/29/2016 at 12:00 pm to Dr. RLavonia Drafts, who verbally acknowledged these results. Electronically Signed   By: JIlona SorrelM.D.   On: 07/29/2016 12:04   Ct Abdomen Pelvis W Contrast  Result Date: 07/29/2016 CLINICAL DATA:  Recent cholecystectomy.  Abdominal pain. EXAM: CT ABDOMEN AND PELVIS WITH CONTRAST TECHNIQUE: Multidetector CT imaging of the abdomen and pelvis was performed using the standard protocol following bolus administration of intravenous contrast. CONTRAST:  10105mISOVUE-300 IOPAMIDOL (ISOVUE-300) INJECTION 61% COMPARISON:  07/22/2016 FINDINGS: Lower chest: Bibasilar atelectasis.  Trace left pleural effusion. Hepatobiliary: Prior cholecystectomy. Fluid noted in the gallbladder fossa and around the liver.  Mild prominence of the common bile duct, measuring up to 10 mm in the porta hepatis. No focal hepatic abnormality. Pancreas: No focal abnormality or ductal dilatation. Spleen: No focal abnormality.  Normal size. Adrenals/Urinary Tract: No adrenal abnormality. No focal renal abnormality. No stones or hydronephrosis. Urinary bladder is unremarkable. Stomach/Bowel: Stomach, large and small bowel grossly unremarkable. Vascular/Lymphatic: No evidence of aneurysm or adenopathy. Reproductive: Uterus and adnexa unremarkable.  No mass. Other: Moderate free fluid noted in the pelvis and in the right abdomen, particularly around in the liver. There is a complex fluid collection noted in the right abdominal wall within the lateral abdominal wall musculature measuring 11 x 3 cm. This likely represents a postoperative hematoma. Musculoskeletal: No acute bony abnormality. IMPRESSION: Prior cholecystectomy. Moderate fluid noted in the abdomen and pelvis, particularly in the  cul-de-sac and around the liver. Fluid in the gallbladder fossa. Cannot exclude bile leak. Mildly prominent common bile duct.  Recommend correlation with LFTs. Large complex fluid collection within the right lateral abdominal wall musculature measuring 11 x 3 cm, likely postoperative hematoma. Electronically Signed   By: Rolm Baptise M.D.   On: 07/29/2016 09:17     Assessment/Plan  LFTs are reviewed. CT scan is been personally reviewed as has a hepatobiliary scan showing a bile leak. This is in the postoperative period the cholangiogram from Dr. Adonis Huguenin surgery was reviewed and no ductal structures could be identified on the 2 films and his report suggests that he was unable to obtain a good glandular gram. My recommendations are to admission to the hospital IV fluids IV antibiotics anti-medics and pain control. I was spoken to Dr. 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 He was in agreement and we'll be happy to see the patient. Thus with the patient and her husband. She is breast-feeding a 13-week-old baby and I will ask for a consult from lactation specialist for pumping.  Florene Glen, MD, FACS

## 2016-07-29 NOTE — ED Notes (Signed)
Patient returned to the ED post Nuclear Medicine test.

## 2016-07-29 NOTE — Consult Note (Signed)
Alexandra Mood MD  [address] [place], [place] [address] Phone: [phone] Fax : [phone]  Consultation  Referring Provider:     Dr Excell Seltzer  Primary Care Physician:  [facility] Primary Gastroenterologist:  None          Reason for Consultation:     Bile leak post surgery   Date of Admission:  07/29/2016 Date of Consultation:  07/29/2016         HPI:   Alexandra Thompson is a 26 y.o. female had a recent cholecystectomy for biliary pancreatitis by Dr Tonita Cong , discharged and subsequently developed abdominal pain , nausea . She came into the ER and subsequently had a HIDA scan which demonstrated a bile leak.   She says she has had RUQ pain since Sunday (today is Tuesday), pain better after meds. Denies any fevers. She is breastfeeding   Past Medical History:  Diagnosis Date  . Medical history non-contributory     Past Surgical History:  Procedure Laterality Date  . CESAREAN SECTION    . CESAREAN SECTION WITH BILATERAL TUBAL LIGATION Bilateral 06/09/2016   Procedure: CESAREAN SECTION WITH BILATERAL TUBAL LIGATION;  Surgeon: Suzy Bouchard, MD;  Location: [facility] ORS;  Service: Obstetrics;  Laterality: Bilateral;  Baby Boy @ 1032 Apgars: 9/9 Weight: 9lb 15oz  . CHOLECYSTECTOMY N/A 07/25/2016   Procedure: LAPAROSCOPIC CHOLECYSTECTOMY WITH INTRAOPERATIVE CHOLANGIOGRAM;  Surgeon: Ricarda Frame, MD;  Location: [facility] ORS;  Service: General;  Laterality: N/A;  . OOPHORECTOMY Left 06/09/2016   Procedure: Alma Friendly;  Surgeon: Suzy Bouchard, MD;  Location: [facility] ORS;  Service: Gynecology;  Laterality: Left;    Prior to Admission medications   Medication Sig Start Date End Date Taking? Authorizing Provider  acetaminophen (TYLENOL) 325 MG tablet Take 325 mg by mouth every 6 (six) hours as needed for headache.   Yes Historical Provider, MD  ibuprofen (ADVIL,MOTRIN) 600 MG tablet Take 1 tablet (600 mg total) by mouth every 6 (six) hours as needed. 06/12/16  Yes  Chelsea C Ward, MD  oxyCODONE-acetaminophen (PERCOCET/ROXICET) 5-325 MG tablet Take 1 tablet by mouth every 6 (six) hours as needed for severe pain (pain scale 4-7). 07/25/16  Yes Srikar Sudini, MD  ferrous sulfate 325 (65 FE) MG tablet Take 1 tablet (325 mg total) by mouth 2 (two) times daily with a meal. Patient not taking: Reported on 07/22/2016 06/12/16   Elenora Fender Ward, MD  senna-docusate (SENOKOT-S) 8.6-50 MG tablet Take 2 tablets by mouth daily. Patient not taking: Reported on 07/22/2016 06/13/16   Elenora Fender Ward, MD    No family history on file.   Social History  Substance Use Topics  . Smoking status: Never Smoker  . Smokeless tobacco: Never Used  . Alcohol use No    Allergies as of 07/29/2016  . (No Known Allergies)    Review of Systems:    All systems reviewed and negative except where noted in HPI.   Physical Exam:  Vital signs in last 24 hours: Temp:  [98.6 F (37 C)] 98.6 F (37 C) (04/10 0338) Pulse Rate:  [63-105] 94 (04/10 1330) Resp:  [18-27] 22 (04/10 1330) BP: (90-127)/(55-74) 113/64 (04/10 1330) SpO2:  [95 %-98 %] 95 % (04/10 1330) Weight:  [175 lb (79.4 kg)] 175 lb (79.4 kg) (04/10 0338)   General:   Pleasant, cooperative in NAD Head:  Normocephalic and atraumatic. Eyes:   No icterus.   Conjunctiva pink. PERRLA. Ears:  Normal auditory acuity. Neck:  Supple; no masses or thyroidomegaly  Lungs: Respirations even and unlabored. Lungs clear to auscultation bilaterally.   No wheezes, crackles, or rhonchi.  Heart:  Regular rate and rhythm;  Without murmur, clicks, rubs or gallops Abdomen:  Bandages seen from recent surgery in the RUQ. Mild tenderness RUQ no guarding or rigidity , BS +  Rectal:  Not performed. Neurologic:  Alert and oriented x3;  grossly normal neurologically. Skin:  Intact without significant lesions or rashes. Cervical Nodes:  No significant cervical adenopathy. Psych:  Alert and cooperative. Normal affect.  LAB RESULTS:  Recent Labs   07/29/16 0428  WBC 11.7*  HGB 12.3  HCT 37.3  PLT 245   BMET  Recent Labs  07/29/16 0428  NA 136  K 3.6  CL 104  CO2 23  GLUCOSE 111*  BUN 14  CREATININE 0.58  CALCIUM 9.0   LFT  Recent Labs  07/29/16 0428  PROT 7.4  ALBUMIN 3.6  AST 44*  ALT 284*  ALKPHOS 370*  BILITOT 1.6*   PT/INR No results for input(s): LABPROT, INR in the last 72 hours.  STUDIES: Nm Hepatobiliary Liver Func  Result Date: 07/29/2016 CLINICAL DATA:  Severe upper abdominal pain, nausea and vomiting for 3 days. Status post laparoscopic cholecystectomy 07/25/2016. Perihepatic, right pericolic gutter and pelvic free fluid on CT study from earlier today. EXAM: NUCLEAR MEDICINE HEPATOBILIARY IMAGING TECHNIQUE: Sequential images of the abdomen were obtained out to 60 minutes following intravenous administration of radiopharmaceutical. RADIOPHARMACEUTICALS:  5.5 mCi Tc-63m  Choletec IV COMPARISON:  07/29/2016 CT abdomen/pelvis. FINDINGS: Prompt uptake and biliary excretion of activity by the liver is seen. There is prompt excretion of radiotracer into the cholecystectomy bed, perihepatic space, right paracolic gutter and deep pelvis, compatible with bile leak. IMPRESSION: Hepatobiliary scintigraphy study is positive for bile leak with significant radiotracer accumulation in the cholecystectomy bed, perihepatic space, right paracolic gutter and deep pelvis. Urgent surgical consultation advised. These results were called by telephone at the time of interpretation on 07/29/2016 at 12:00 pm to Dr. Jene Every , who verbally acknowledged these results. Electronically Signed   By: Delbert Phenix M.D.   On: 07/29/2016 12:04   Ct Abdomen Pelvis W Contrast  Result Date: 07/29/2016 CLINICAL DATA:  Recent cholecystectomy.  Abdominal pain. EXAM: CT ABDOMEN AND PELVIS WITH CONTRAST TECHNIQUE: Multidetector CT imaging of the abdomen and pelvis was performed using the standard protocol following bolus administration of  intravenous contrast. CONTRAST:  ISOVUE-300 IOPAMIDOL (ISOVUE-300) INJECTION 61% COMPARISON:  07/22/2016 FINDINGS: Lower chest: Bibasilar atelectasis.  Trace left pleural effusion. Hepatobiliary: Prior cholecystectomy. Fluid noted in the gallbladder fossa and around the liver. Mild prominence of the common bile duct, measuring up to 10 mm in the porta hepatis. No focal hepatic abnormality. Pancreas: No focal abnormality or ductal dilatation. Spleen: No focal abnormality.  Normal size. Adrenals/Urinary Tract: No adrenal abnormality. No focal renal abnormality. No stones or hydronephrosis. Urinary bladder is unremarkable. Stomach/Bowel: Stomach, large and small bowel grossly unremarkable. Vascular/Lymphatic: No evidence of aneurysm or adenopathy. Reproductive: Uterus and adnexa unremarkable.  No mass. Other: Moderate free fluid noted in the pelvis and in the right abdomen, particularly around in the liver. There is a complex fluid collection noted in the right abdominal wall within the lateral abdominal wall musculature measuring 11 x 3 cm. This likely represents a postoperative hematoma. Musculoskeletal: No acute bony abnormality. IMPRESSION: Prior cholecystectomy. Moderate fluid noted in the abdomen and pelvis, particularly in the cul-de-sac and around the liver. Fluid in the gallbladder fossa. Cannot exclude bile  leak. Mildly prominent common bile duct.  Recommend correlation with LFTs. Large complex fluid collection within the right lateral abdominal wall musculature measuring 11 x 3 cm, likely postoperative hematoma. Electronically Signed   By: Charlett Nose M.D.   On: 07/29/2016 09:17      Impression / Plan:   Bellamarie Pflug is a 26 y.o. y/o female s/p cholecystectomy and now has come in with a bile leak confirmed on HIDA scan .    Plan  1. ERCP- I discussed with Dr Servando Snare and he plans to perform the ERCP on Thursday   2. Serial abdominal exam, watch for fevers .   Thank you for involving me in  the care of this patient.      LOS: 0 days   Alexandra Mood, MD  07/29/2016, 2:48 PM

## 2016-07-29 NOTE — ED Provider Notes (Signed)
Discussed CT results with Dr. Excell Seltzer, he requested HIDA scan and will see the patient after he gets out of the operating room.   Jene Every, MD 07/29/16 [id number]

## 2016-07-29 NOTE — ED Triage Notes (Signed)
Pt to triage via w/c with no distress noted; pt reports cholecystectomy on Friday; rx percocet & motrin but having persistent abd pain with N/V

## 2016-07-29 NOTE — ED Notes (Signed)
Pt presents to ED 07 c/o abdominal pain of 9/10; pt states having abdominal surgery this past Friday to have gallbladder removed; pt states having nausea, and vomiting, but no diarrhea; pt states light headedness, dizziness, and shortness of breath; pt states taking percocet at 2300 on 07/28/16 and motrin at 0130 on 4/10 with little relief

## 2016-07-29 NOTE — Telephone Encounter (Signed)
Patient stated she was seen in the Emergency department. HIDA scan oordered per Dr.Cooper through while in ER.

## 2016-07-29 NOTE — ED Provider Notes (Signed)
[facility] Emergency Department Provider Note   ____________________________________________   First MD Initiated Contact with Patient 07/29/16 0413     (approximate)  I have reviewed the triage vital signs and the nursing notes.   HISTORY  Chief Complaint Abdominal Pain    HPI Alexandra Thompson is a 26 y.o. female who comes into the hospital today with abdominal pain. The patient had a cholecystectomy on Friday. She reports that she's been taking pain medicine but it is not helping. The patient reports that she vomited once and has had some nausea. The emesis looked like her food. The patient rates her pain 8-9 out of 10 in intensity and is in her upper abdomen. She's had some shortness of breath as well as some dizziness. The patient reports that she's been urinating well but has not had many bowel movements. The patient is here today for evaluation.   Past Medical History:  Diagnosis Date  . Medical history non-contributory     Patient Active Problem List   Diagnosis Date Noted  . Acute pancreatitis 07/22/2016  . Post-operative state 06/09/2016  . First trimester screening 11/29/2015    Past Surgical History:  Procedure Laterality Date  . CESAREAN SECTION    . CESAREAN SECTION WITH BILATERAL TUBAL LIGATION Bilateral 06/09/2016   Procedure: CESAREAN SECTION WITH BILATERAL TUBAL LIGATION;  Surgeon: Suzy Bouchard, MD;  Location: [facility] ORS;  Service: Obstetrics;  Laterality: Bilateral;  Baby Boy @ 1032 Apgars: 9/9 Weight: 9lb 15oz  . CHOLECYSTECTOMY N/A 07/25/2016   Procedure: LAPAROSCOPIC CHOLECYSTECTOMY WITH INTRAOPERATIVE CHOLANGIOGRAM;  Surgeon: Ricarda Frame, MD;  Location: [facility] ORS;  Service: General;  Laterality: N/A;  . OOPHORECTOMY Left 06/09/2016   Procedure: Alma Friendly;  Surgeon: Suzy Bouchard, MD;  Location: [facility] ORS;  Service: Gynecology;  Laterality: Left;    Prior to Admission medications   Medication Sig Start Date  End Date Taking? Authorizing Provider  acetaminophen (TYLENOL) 325 MG tablet Take 325 mg by mouth every 6 (six) hours as needed for headache.   Yes Historical Provider, MD  ibuprofen (ADVIL,MOTRIN) 600 MG tablet Take 1 tablet (600 mg total) by mouth every 6 (six) hours as needed. 06/12/16  Yes Chelsea C Ward, MD  oxyCODONE-acetaminophen (PERCOCET/ROXICET) 5-325 MG tablet Take 1 tablet by mouth every 6 (six) hours as needed for severe pain (pain scale 4-7). 07/25/16  Yes Srikar Sudini, MD  ferrous sulfate 325 (65 FE) MG tablet Take 1 tablet (325 mg total) by mouth 2 (two) times daily with a meal. Patient not taking: Reported on 07/22/2016 06/12/16   Elenora Fender Ward, MD  senna-docusate (SENOKOT-S) 8.6-50 MG tablet Take 2 tablets by mouth daily. Patient not taking: Reported on 07/22/2016 06/13/16   Elenora Fender Ward, MD    Allergies Patient has no known allergies.  No family history on file.  Social History Social History  Substance Use Topics  . Smoking status: Never Smoker  . Smokeless tobacco: Never Used  . Alcohol use No    Review of Systems Constitutional: No fever/chills Eyes: No visual changes. ENT: No sore throat. Cardiovascular: Denies chest pain. Respiratory: Denies shortness of breath. Gastrointestinal:  abdominal pain, nausea, vomiting.  No diarrhea.  No constipation. Genitourinary: Negative for dysuria. Musculoskeletal: Negative for back pain. Skin: Negative for rash. Neurological: Negative for headaches, focal weakness or numbness.  10-point ROS otherwise negative.  ____________________________________________   PHYSICAL EXAM:  VITAL SIGNS: ED Triage Vitals [07/29/16 0338]  Enc Vitals Group     BP Marland Kitchen)  90/55     Pulse Rate (!) 105     Resp 18     Temp 98.6 F (37 C)     Temp Source Oral     SpO2 98 %     Weight 175 lb (79.4 kg)     Height  (1.448 m)     Head Circumference      Peak Flow      Pain Score 9     Pain Loc      Pain Edu?      Excl. in GC?      Constitutional: Alert and oriented. Well appearing and in Moderate distress. Eyes: Conjunctivae are normal. PERRL. EOMI. Head: Atraumatic. Nose: No congestion/rhinnorhea. Mouth/Throat: Mucous membranes are moist.  Oropharynx non-erythematous. Cardiovascular: Normal rate, regular rhythm. Grossly normal heart sounds.  Good peripheral circulation. Respiratory: Normal respiratory effort.  No retractions. Lungs CTAB. Gastrointestinal: Soft with some epigastric and right upper quadrant tenderness to palpation. No distention. Positive bowel sounds Musculoskeletal: No lower extremity tenderness nor edema.   Neurologic:  Normal speech and language.  Skin:  Skin is warm, dry and intact.  Psychiatric: Mood and affect are normal. .  ____________________________________________   LABS (all labs ordered are listed, but only abnormal results are displayed)  Labs Reviewed  CBC - Abnormal; Notable for the following:       Result Value   WBC 11.7 (*)    RDW 15.0 (*)    All other components within normal limits  COMPREHENSIVE METABOLIC PANEL - Abnormal; Notable for the following:    Glucose, Bld 111 (*)    AST 44 (*)    ALT 284 (*)    Alkaline Phosphatase 370 (*)    Total Bilirubin 1.6 (*)    All other components within normal limits  LIPASE, BLOOD   ____________________________________________  EKG  none ____________________________________________  RADIOLOGY  CT abd and pelvis ____________________________________________   PROCEDURES  Procedure(s) performed: None  Procedures  Critical Care performed: No  ____________________________________________   INITIAL IMPRESSION / ASSESSMENT AND PLAN / ED COURSE  Pertinent labs & imaging results that were available during my care of the patient were reviewed by me and considered in my medical decision making (see chart for details).  This is a 26 year old female who comes into the hospital today with abdominal pain status  post cholecystectomy. Given the patient's recent cholecystectomy my concern is for possible abscess or fluid collection, pancreatitis or choledocholithiasis. I will send the patient for a CT scan for evaluation. I will give the patient some Zofran and morphine. The patient will be reassessed. She does have some elevated liver enzymes. Her liver enzymes are slightly elevated from previous. The patient had presented with pancreatitis.     The patient's care will be signed out to Dr. Cyril Loosen who will follow-up the results of her CT scan and disposition the patient. ____________________________________________   FINAL CLINICAL IMPRESSION(S) / ED DIAGNOSES  Final diagnoses:  Pain of upper abdomen      NEW MEDICATIONS STARTED DURING THIS VISIT:  New Prescriptions   No medications on file     Note:  This document was prepared using Dragon voice recognition software and may include unintentional dictation errors.    Rebecka Apley, MD 07/29/16 [id number]

## 2016-07-30 LAB — COMPREHENSIVE METABOLIC PANEL
ALT: 172 U/L — ABNORMAL HIGH (ref 14–54)
AST: 30 U/L (ref 15–41)
Albumin: 2.9 g/dL — ABNORMAL LOW (ref 3.5–5.0)
Alkaline Phosphatase: 341 U/L — ABNORMAL HIGH (ref 38–126)
Anion gap: 7 (ref 5–15)
BILIRUBIN TOTAL: 2.2 mg/dL — AB (ref 0.3–1.2)
BUN: 11 mg/dL (ref 6–20)
CO2: 29 mmol/L (ref 22–32)
CREATININE: 0.75 mg/dL (ref 0.44–1.00)
Calcium: 8.7 mg/dL — ABNORMAL LOW (ref 8.9–10.3)
Chloride: 102 mmol/L (ref 101–111)
Glucose, Bld: 103 mg/dL — ABNORMAL HIGH (ref 65–99)
Potassium: 3.7 mmol/L (ref 3.5–5.1)
Sodium: 138 mmol/L (ref 135–145)
TOTAL PROTEIN: 6.7 g/dL (ref 6.5–8.1)

## 2016-07-30 LAB — CBC
HEMATOCRIT: 35 % (ref 35.0–47.0)
Hemoglobin: 11.6 g/dL — ABNORMAL LOW (ref 12.0–16.0)
MCH: 28.5 pg (ref 26.0–34.0)
MCHC: 33 g/dL (ref 32.0–36.0)
MCV: 86.2 fL (ref 80.0–100.0)
PLATELETS: 248 10*3/uL (ref 150–440)
RBC: 4.07 MIL/uL (ref 3.80–5.20)
RDW: 15 % — AB (ref 11.5–14.5)
WBC: 12.5 10*3/uL — AB (ref 3.6–11.0)

## 2016-07-30 MED ORDER — ACETAMINOPHEN 500 MG PO TABS
1000.0000 mg | ORAL_TABLET | Freq: Four times a day (QID) | ORAL | Status: DC | PRN
  Administered 2016-07-30 – 2016-08-01 (×3): 1000 mg via ORAL
  Filled 2016-07-30 (×3): qty 2

## 2016-07-30 MED ORDER — ALUM & MAG HYDROXIDE-SIMETH 200-200-20 MG/5ML PO SUSP
30.0000 mL | Freq: Four times a day (QID) | ORAL | Status: DC | PRN
  Administered 2016-07-30: 30 mL via ORAL
  Filled 2016-07-30: qty 30

## 2016-07-30 MED ORDER — KETOROLAC TROMETHAMINE 30 MG/ML IJ SOLN
30.0000 mg | Freq: Four times a day (QID) | INTRAMUSCULAR | Status: DC
  Administered 2016-07-30 – 2016-08-02 (×13): 30 mg via INTRAVENOUS
  Filled 2016-07-30 (×13): qty 1

## 2016-07-30 MED ORDER — HYDROMORPHONE HCL 1 MG/ML IJ SOLN
0.5000 mg | INTRAMUSCULAR | Status: DC | PRN
  Administered 2016-07-30 – 2016-07-31 (×7): 0.5 mg via INTRAVENOUS
  Filled 2016-07-30 (×7): qty 0.5

## 2016-07-30 MED ORDER — OXYCODONE HCL 5 MG PO TABS
5.0000 mg | ORAL_TABLET | ORAL | Status: DC | PRN
  Administered 2016-07-30: 5 mg via ORAL
  Administered 2016-07-30 (×2): 10 mg via ORAL
  Filled 2016-07-30 (×2): qty 2
  Filled 2016-07-30 (×2): qty 1

## 2016-07-30 NOTE — Progress Notes (Signed)
CC: Abdominal pain Subjective: Patient admitted with a postop bile leak. She feels about the same today she has received pain medication and feels better this morning no nausea or vomiting no fevers or chills  Objective: Vital signs in last 24 hours: Temp:  [97.9 F (36.6 C)-98.8 F (37.1 C)] 97.9 F (36.6 C) (04/11 0502) Pulse Rate:  [63-123] 106 (04/11 0829) Resp:  [16-29] 18 (04/11 0502) BP: (105-130)/(56-76) 130/72 (04/11 0829) SpO2:  [94 %-98 %] 96 % (04/11 0829) Last BM Date: 07/28/16  Intake/Output from previous day: No intake/output data recorded. Intake/Output this shift: Total I/O In: 257 [I.V.:257] Out: 0   Physical exam:  Obese postpartum female abdomen is soft and minimally tender no peritoneal signs wounds are clean no erythema no drainage nontender calves  Lab Results: CBC   Recent Labs  07/29/16 0428 07/30/16 0519  WBC 11.7* 12.5*  HGB 12.3 11.6*  HCT 37.3 35.0  PLT 245 248   BMET  Recent Labs  07/29/16 0428 07/30/16 0519  NA 136 138  K 3.6 3.7  CL 104 102  CO2 23 29  GLUCOSE 111* 103*  BUN 14 11  CREATININE 0.58 0.75  CALCIUM 9.0 8.7*   PT/INR No results for input(s): LABPROT, INR in the last 72 hours. ABG No results for input(s): PHART, HCO3 in the last 72 hours.  Invalid input(s): PCO2, PO2  Studies/Results: Nm Hepatobiliary Liver Func  Result Date: 07/29/2016 CLINICAL DATA:  Severe upper abdominal pain, nausea and vomiting for 3 days. Status post laparoscopic cholecystectomy 07/25/2016. Perihepatic, right pericolic gutter and pelvic free fluid on CT study from earlier today. EXAM: NUCLEAR MEDICINE HEPATOBILIARY IMAGING TECHNIQUE: Sequential images of the abdomen were obtained out to 60 minutes following intravenous administration of radiopharmaceutical. RADIOPHARMACEUTICALS:  5.5 mCi Tc-1m  Choletec IV COMPARISON:  07/29/2016 CT abdomen/pelvis. FINDINGS: Prompt uptake and biliary excretion of activity by the liver is seen. There is  prompt excretion of radiotracer into the cholecystectomy bed, perihepatic space, right paracolic gutter and deep pelvis, compatible with bile leak. IMPRESSION: Hepatobiliary scintigraphy study is positive for bile leak with significant radiotracer accumulation in the cholecystectomy bed, perihepatic space, right paracolic gutter and deep pelvis. Urgent surgical consultation advised. These results were called by telephone at the time of interpretation on 07/29/2016 at 12:00 pm to Dr. Jene Every , who verbally acknowledged these results. Electronically Signed   By: Delbert Phenix M.D.   On: 07/29/2016 12:04   Ct Abdomen Pelvis W Contrast  Result Date: 07/29/2016 CLINICAL DATA:  Recent cholecystectomy.  Abdominal pain. EXAM: CT ABDOMEN AND PELVIS WITH CONTRAST TECHNIQUE: Multidetector CT imaging of the abdomen and pelvis was performed using the standard protocol following bolus administration of intravenous contrast. CONTRAST:  ISOVUE-300 IOPAMIDOL (ISOVUE-300) INJECTION 61% COMPARISON:  07/22/2016 FINDINGS: Lower chest: Bibasilar atelectasis.  Trace left pleural effusion. Hepatobiliary: Prior cholecystectomy. Fluid noted in the gallbladder fossa and around the liver. Mild prominence of the common bile duct, measuring up to 10 mm in the porta hepatis. No focal hepatic abnormality. Pancreas: No focal abnormality or ductal dilatation. Spleen: No focal abnormality.  Normal size. Adrenals/Urinary Tract: No adrenal abnormality. No focal renal abnormality. No stones or hydronephrosis. Urinary bladder is unremarkable. Stomach/Bowel: Stomach, large and small bowel grossly unremarkable. Vascular/Lymphatic: No evidence of aneurysm or adenopathy. Reproductive: Uterus and adnexa unremarkable.  No mass. Other: Moderate free fluid noted in the pelvis and in the right abdomen, particularly around in the liver. There is a complex fluid collection noted in  the right abdominal wall within the lateral abdominal wall musculature  measuring 11 x 3 cm. This likely represents a postoperative hematoma. Musculoskeletal: No acute bony abnormality. IMPRESSION: Prior cholecystectomy. Moderate fluid noted in the abdomen and pelvis, particularly in the cul-de-sac and around the liver. Fluid in the gallbladder fossa. Cannot exclude bile leak. Mildly prominent common bile duct.  Recommend correlation with LFTs. Large complex fluid collection within the right lateral abdominal wall musculature measuring 11 x 3 cm, likely postoperative hematoma. Electronically Signed   By: Charlett Nose M.D.   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 Her care is been discussed with Dr. Servando Snare who is planning ERCP at his convenience once time is available.  Lattie Haw, MD, FACS  07/30/2016

## 2016-07-30 NOTE — Progress Notes (Signed)
Chaplain rounding unit visited with the Pt, but Pt was asleep at the time. CH spoke with Pt's family member who was at bedside. CH to make a follow up visit with Pt again soon.    07/30/16 1400  Clinical Encounter Type  Visited With Patient;Patient and family together  Visit Type Initial;Spiritual support  Referral From Chaplain  Consult/Referral To Chaplain  Spiritual Encounters  Spiritual Needs Prayer

## 2016-07-31 ENCOUNTER — Encounter: Payer: Self-pay | Admitting: Anesthesiology

## 2016-07-31 ENCOUNTER — Inpatient Hospital Stay: Payer: Medicaid Other | Admitting: *Deleted

## 2016-07-31 ENCOUNTER — Encounter
Admission: EM | Disposition: A | Discharge status: Home / Self Care | Payer: Self-pay | Source: Home / Self Care | Attending: Surgery

## 2016-07-31 ENCOUNTER — Inpatient Hospital Stay: Payer: Medicaid Other

## 2016-07-31 DIAGNOSIS — K838 Other specified diseases of biliary tract: Secondary | ICD-10-CM

## 2016-07-31 HISTORY — PX: ENDOSCOPIC RETROGRADE CHOLANGIOPANCREATOGRAPHY (ERCP) WITH PROPOFOL: SHX5810

## 2016-07-31 LAB — CBC WITH DIFFERENTIAL/PLATELET
BASOS ABS: 0 10*3/uL (ref 0–0.1)
BASOS PCT: 0 %
EOS ABS: 0 10*3/uL (ref 0–0.7)
EOS PCT: 0 %
HCT: 34 % — ABNORMAL LOW (ref 35.0–47.0)
Hemoglobin: 11.1 g/dL — ABNORMAL LOW (ref 12.0–16.0)
Lymphocytes Relative: 11 %
Lymphs Abs: 1.4 10*3/uL (ref 1.0–3.6)
MCH: 28.4 pg (ref 26.0–34.0)
MCHC: 32.6 g/dL (ref 32.0–36.0)
MCV: 87.1 fL (ref 80.0–100.0)
Monocytes Absolute: 0.7 10*3/uL (ref 0.2–0.9)
Monocytes Relative: 5 %
Neutro Abs: 11 10*3/uL — ABNORMAL HIGH (ref 1.4–6.5)
Neutrophils Relative %: 84 %
PLATELETS: 256 10*3/uL (ref 150–440)
RBC: 3.9 MIL/uL (ref 3.80–5.20)
RDW: 15.2 % — ABNORMAL HIGH (ref 11.5–14.5)
WBC: 13.1 10*3/uL — ABNORMAL HIGH (ref 3.6–11.0)

## 2016-07-31 LAB — COMPREHENSIVE METABOLIC PANEL
ALT: 108 U/L — ABNORMAL HIGH (ref 14–54)
AST: 19 U/L (ref 15–41)
Albumin: 2.7 g/dL — ABNORMAL LOW (ref 3.5–5.0)
Alkaline Phosphatase: 346 U/L — ABNORMAL HIGH (ref 38–126)
Anion gap: 4 — ABNORMAL LOW (ref 5–15)
BUN: 13 mg/dL (ref 6–20)
CHLORIDE: 100 mmol/L — AB (ref 101–111)
CO2: 31 mmol/L (ref 22–32)
Calcium: 8.8 mg/dL — ABNORMAL LOW (ref 8.9–10.3)
Creatinine, Ser: 0.61 mg/dL (ref 0.44–1.00)
GFR calc Af Amer: >60 mL/min (ref >60)
GFR calc non Af Amer: >60 mL/min (ref >60)
Glucose, Bld: 98 mg/dL (ref 65–99)
Potassium: 4.2 mmol/L (ref 3.5–5.1)
SODIUM: 135 mmol/L (ref 135–145)
Total Bilirubin: 2.1 mg/dL — ABNORMAL HIGH (ref 0.3–1.2)
Total Protein: 6.5 g/dL (ref 6.5–8.1)

## 2016-07-31 SURGERY — ENDOSCOPIC RETROGRADE CHOLANGIOPANCREATOGRAPHY (ERCP) WITH PROPOFOL
Anesthesia: General

## 2016-07-31 MED ORDER — DEXAMETHASONE SODIUM PHOSPHATE 10 MG/ML IJ SOLN
INTRAMUSCULAR | Status: DC | PRN
  Administered 2016-07-31: 5 mg via INTRAVENOUS

## 2016-07-31 MED ORDER — PROMETHAZINE HCL 25 MG/ML IJ SOLN
6.2500 mg | INTRAMUSCULAR | Status: DC | PRN
Start: 2016-07-31 — End: 2016-07-31

## 2016-07-31 MED ORDER — PROPOFOL 10 MG/ML IV BOLUS
INTRAVENOUS | Status: DC | PRN
  Administered 2016-07-31: 170 mg via INTRAVENOUS

## 2016-07-31 MED ORDER — MIDAZOLAM HCL 2 MG/2ML IJ SOLN
INTRAMUSCULAR | Status: AC
  Filled 2016-07-31: qty 2

## 2016-07-31 MED ORDER — SODIUM CHLORIDE 0.9 % IV SOLN: INTRAVENOUS | Status: DC

## 2016-07-31 MED ORDER — FENTANYL CITRATE (PF) 100 MCG/2ML IJ SOLN
INTRAMUSCULAR | Status: AC
  Filled 2016-07-31: qty 2

## 2016-07-31 MED ORDER — PROPOFOL 10 MG/ML IV BOLUS
INTRAVENOUS | Status: AC
  Filled 2016-07-31: qty 40

## 2016-07-31 MED ORDER — INDOMETHACIN 50 MG RE SUPP
RECTAL | Status: DC | PRN
  Administered 2016-07-31 (×2): 100 mg via RECTAL

## 2016-07-31 MED ORDER — LIDOCAINE HCL (CARDIAC) 20 MG/ML IV SOLN
INTRAVENOUS | Status: DC | PRN
  Administered 2016-07-31: 100 mg via INTRAVENOUS

## 2016-07-31 MED ORDER — FENTANYL CITRATE (PF) 100 MCG/2ML IJ SOLN: 25.0000 ug | INTRAMUSCULAR | Status: DC | PRN

## 2016-07-31 MED ORDER — SODIUM CHLORIDE 0.9 % IV SOLN
INTRAVENOUS | Status: DC
  Administered 2016-07-31: 12:00:00 via INTRAVENOUS

## 2016-07-31 MED ORDER — ONDANSETRON HCL 4 MG/2ML IJ SOLN
INTRAMUSCULAR | Status: DC | PRN
  Administered 2016-07-31: 4 mg via INTRAVENOUS

## 2016-07-31 MED ORDER — SUCCINYLCHOLINE CHLORIDE 20 MG/ML IJ SOLN
INTRAMUSCULAR | Status: DC | PRN
  Administered 2016-07-31: 100 mg via INTRAVENOUS

## 2016-07-31 MED ORDER — MIDAZOLAM HCL 2 MG/2ML IJ SOLN
INTRAMUSCULAR | Status: DC | PRN
  Administered 2016-07-31 (×2): 1 mg via INTRAVENOUS

## 2016-07-31 MED ORDER — INDOMETHACIN 50 MG RE SUPP
100.0000 mg | Freq: Once | RECTAL | Status: DC
  Filled 2016-07-31: qty 2

## 2016-07-31 NOTE — Anesthesia Post-op Follow-up Note (Cosign Needed)
Anesthesia QCDR form completed.        

## 2016-07-31 NOTE — Op Note (Signed)
[facility] Gastroenterology Patient Name: Alexandra Thompson Procedure Date: 07/31/2016 11:39 AM MRN: [id number] Account #: [id number] Date of Birth: Nov 13, 1990 Admit Type: Inpatient Age: 26 Room: [facility] ENDO ROOM 4 Gender: Female Note Status: Finalized Procedure:            ERCP Indications:          Bile leak Providers:            Midge Minium MD, MD Referring MD:         No Local Md, MD (Referring MD) Medicines:            General Anesthesia Complications:        No immediate complications. Procedure:            Pre-Anesthesia Assessment:                       - Prior to the procedure, a History and Physical was                        performed, and patient medications and allergies were                        reviewed. The patient's tolerance of previous                        anesthesia was also reviewed. The risks and benefits of                        the procedure and the sedation options and risks were                        discussed with the patient. All questions were                        answered, and informed consent was obtained. Prior                        Anticoagulants: The patient has taken no previous                        anticoagulant or antiplatelet agents. ASA Grade                        Assessment: II - A patient with mild systemic disease.                        After reviewing the risks and benefits, the patient was                        deemed in satisfactory condition to undergo the                        procedure.                       After obtaining informed consent, the scope was passed                        under direct vision. Throughout the procedure, the  patient's blood pressure, pulse, and oxygen saturations                        were monitored continuously. The ERCP was introduced                        through the mouth, and used to inject contrast into and                        used to inject  contrast into the bile duct. The ERCP                        was accomplished without difficulty. The patient                        tolerated the procedure well. Findings:      A scout film of the abdomen was obtained. Surgical clips were seen in       the area of the cystic duct. The esophagus was successfully intubated       under direct vision. The scope was advanced to a normal major papilla in       the descending duodenum without detailed examination of the pharynx,       larynx and associated structures, and upper GI tract. The upper GI tract       was grossly normal. The bile duct was deeply cannulated with the       short-nosed traction sphincterotome. Contrast was injected. I personally       interpreted the bile duct images. There was brisk flow of contrast       through the ducts. Image quality was excellent. Contrast extended to the       entire biliary tree. Extravasation of contrast originating from the       right upper quadrant of the abdomen was observed. A wire was passed into       the biliary tree. Biliary sphincterotomy was made with a traction       (standard) sphincterotome using ERBE electrocautery. There was no       post-sphincterotomy bleeding. One 10 Fr by 7 cm plastic stent with a       single external flap and a single internal flap was placed 3 cm into the       common bile duct. Bile flowed through the stent. The stent was in good       position. Impression:           - A bile leak was found.                       - A biliary sphincterotomy was performed.                       - One plastic stent was placed into the common bile                        duct. Recommendation:       - Watch for pancreatitis, bleeding, perforation, and                        cholangitis.                       -  Repeat ERCP in 2 months to remove stent. Procedure Code(s):    --- Professional ---                       [phone], Endoscopic retrograde cholangiopancreatography                         (ERCP); with placement of endoscopic stent into biliary                        or pancreatic duct, including pre- and post-dilation                        and guide wire passage, when performed, including                        sphincterotomy, when performed, each stent                       [id number], Endoscopic catheterization of the biliary ductal                        system, radiological supervision and interpretation Diagnosis Code(s):    --- Professional ---                       K83.8, Other specified diseases of biliary tract CPT copyright 2016 American Medical Association. All rights reserved. The codes documented in this report are preliminary and upon coder review may  be revised to meet current compliance requirements. Midge Minium MD, MD 07/31/2016 12:23:48 PM This report has been signed electronically. Number of Addenda: 0 Note Initiated On: 07/31/2016 11:39 AM      [facility]

## 2016-07-31 NOTE — OR Nursing (Signed)
Dr. Henrene Hawking notified of x-ray results , okay to transfer pt. To room 202 .

## 2016-07-31 NOTE — Anesthesia Preprocedure Evaluation (Signed)
Anesthesia Evaluation  Patient identified by MRN, date of birth, ID band Patient awake    Reviewed: Allergy & Precautions, H&P , NPO status , Patient's Chart, lab work & pertinent test results, reviewed documented beta blocker date and time   History of Anesthesia Complications Negative for: history of anesthetic complications  Airway Mallampati: II  TM Distance: >3 FB Neck ROM: full    Dental   Pulmonary neg pulmonary ROS,           Cardiovascular Exercise Tolerance: Good negative cardio ROS       Neuro/Psych negative neurological ROS  negative psych ROS   GI/Hepatic negative GI ROS, Neg liver ROS,   Endo/Other  negative endocrine ROS  Renal/GU negative Renal ROS  negative genitourinary   Musculoskeletal   Abdominal   Peds  Hematology negative hematology ROS (+)   Anesthesia Other Findings Past Medical History: No date: Medical history non-contributory   Reproductive/Obstetrics (+) Breast feeding                              Anesthesia Physical Anesthesia Plan  ASA: I  Anesthesia Plan: General   Post-op Pain Management:    Induction:   Airway Management Planned:   Additional Equipment:   Intra-op Plan:   Post-operative Plan:   Informed Consent: I have reviewed the patients History and Physical, chart, labs and discussed the procedure including the risks, benefits and alternatives for the proposed anesthesia with the patient or authorized representative who has indicated his/her understanding and acceptance.   Dental Advisory Given  Plan Discussed with: Anesthesiologist, CRNA and Surgeon  Anesthesia Plan Comments:         Anesthesia Quick Evaluation

## 2016-07-31 NOTE — Progress Notes (Signed)
CC: Right upper quadrant pain Subjective: This a patient with an obvious bile leak who is for ERCP today. She feels better today but still has right upper quadrant pain with some nausea. No bile vomiting.  Objective: Vital signs in last 24 hours: Temp:  [98.3 F (36.8 C)-99.3 F (37.4 C)] 99.2 F (37.3 C) (04/12 0630) Pulse Rate:  [99-119] 119 (04/12 0630) Resp:  [18-24] 24 (04/12 0630) BP: (112-130)/(57-72) 116/68 (04/12 0630) SpO2:  [79 %-96 %] 79 % (04/12 0630) Last BM Date: 07/28/16  Intake/Output from previous day: 04/11 0701 - 04/12 0700 In: 1985 [P.O.:240; I.V.:1645; IV Piggyback:100] Out: 275 [Urine:275] Intake/Output this shift: No intake/output data recorded.  Physical exam:  Morbidly obese abdomen is soft and minimally tender in the right upper quadrant no icterus no jaundice  Lab Results: CBC   Recent Labs  07/30/16 0519 07/31/16 0617  WBC 12.5* 13.1*  HGB 11.6* 11.1*  HCT 35.0 34.0*  PLT 248 256   BMET  Recent Labs  07/30/16 0519 07/31/16 0617  NA 138 135  K 3.7 4.2  CL 102 100*  CO2 29 31  GLUCOSE 103* 98  BUN 11 13  CREATININE 0.75 0.61  CALCIUM 8.7* 8.8*   PT/INR No results for input(s): LABPROT, INR in the last 72 hours. ABG No results for input(s): PHART, HCO3 in the last 72 hours.  Invalid input(s): PCO2, PO2  Studies/Results: Nm Hepatobiliary Liver Func  Result Date: 07/29/2016 CLINICAL DATA:  Severe upper abdominal pain, nausea and vomiting for 3 days. Status post laparoscopic cholecystectomy 07/25/2016. Perihepatic, right pericolic gutter and pelvic free fluid on CT study from earlier today. EXAM: NUCLEAR MEDICINE HEPATOBILIARY IMAGING TECHNIQUE: Sequential images of the abdomen were obtained out to 60 minutes following intravenous administration of radiopharmaceutical. RADIOPHARMACEUTICALS:  5.5 mCi Tc-21m  Choletec IV COMPARISON:  07/29/2016 CT abdomen/pelvis. FINDINGS: Prompt uptake and biliary excretion of activity by the liver  is seen. There is prompt excretion of radiotracer into the cholecystectomy bed, perihepatic space, right paracolic gutter and deep pelvis, compatible with bile leak. IMPRESSION: Hepatobiliary scintigraphy study is positive for bile leak with significant radiotracer accumulation in the cholecystectomy bed, perihepatic space, right paracolic gutter and deep pelvis. Urgent surgical consultation advised. These results were called by telephone at the time of interpretation on 07/29/2016 at 12:00 pm to Dr. Jene Every , who verbally acknowledged these results. Electronically Signed   By: Delbert Phenix M.D.   On: 07/29/2016 12:04   Ct Abdomen Pelvis W Contrast  Result Date: 07/29/2016 CLINICAL DATA:  Recent cholecystectomy.  Abdominal pain. EXAM: CT ABDOMEN AND PELVIS WITH CONTRAST TECHNIQUE: Multidetector CT imaging of the abdomen and pelvis was performed using the standard protocol following bolus administration of intravenous contrast. CONTRAST:  ISOVUE-300 IOPAMIDOL (ISOVUE-300) INJECTION 61% COMPARISON:  07/22/2016 FINDINGS: Lower chest: Bibasilar atelectasis.  Trace left pleural effusion. Hepatobiliary: Prior cholecystectomy. Fluid noted in the gallbladder fossa and around the liver. Mild prominence of the common bile duct, measuring up to 10 mm in the porta hepatis. No focal hepatic abnormality. Pancreas: No focal abnormality or ductal dilatation. Spleen: No focal abnormality.  Normal size. Adrenals/Urinary Tract: No adrenal abnormality. No focal renal abnormality. No stones or hydronephrosis. Urinary bladder is unremarkable. Stomach/Bowel: Stomach, large and small bowel grossly unremarkable. Vascular/Lymphatic: No evidence of aneurysm or adenopathy. Reproductive: Uterus and adnexa unremarkable.  No mass. Other: Moderate free fluid noted in the pelvis and in the right abdomen, particularly around in the liver. There is a complex fluid  collection noted in the right abdominal wall within the lateral  abdominal wall musculature measuring 11 x 3 cm. This likely represents a postoperative hematoma. Musculoskeletal: No acute bony abnormality. IMPRESSION: Prior cholecystectomy. Moderate fluid noted in the abdomen and pelvis, particularly in the cul-de-sac and around the liver. Fluid in the gallbladder fossa. Cannot exclude bile leak. Mildly prominent common bile duct.  Recommend correlation with LFTs. Large complex fluid collection within the right lateral abdominal wall musculature measuring 11 x 3 cm, likely postoperative hematoma. Electronically Signed   By: Charlett Nose M.D.   On: 07/29/2016 09:17    Anti-infectives: Anti-infectives    Start     Dose/Rate Route Frequency Ordered Stop   07/29/16 1230  Ampicillin-Sulbactam (UNASYN) 3 g in sodium chloride 0.9 % 100 mL IVPB     3 g 200 mL/hr over 30 Minutes Intravenous Every 6 hours 07/29/16 1217        Assessment/Plan:  LFTs are reviewed. Recommend ERCP today Dr. Servando Snare is to perform that today to diagnosis the type of leak and to treat it with a stent. This was discussed with the patient no family was present. We will update after the planned procedure.  Lattie Haw, MD, FACS  07/31/2016

## 2016-07-31 NOTE — Transfer of Care (Signed)
Immediate Anesthesia Transfer of Care Note  Patient: Alexandra Thompson  Procedure(s) Performed: Procedure(s): ENDOSCOPIC RETROGRADE CHOLANGIOPANCREATOGRAPHY (ERCP) WITH PROPOFOL (N/A)  Patient Location: PACU  Anesthesia Type:General  Level of Consciousness: awake, alert  and oriented  Airway & Oxygen Therapy: Patient Spontanous Breathing and Patient connected to face mask oxygen  Post-op Assessment: Report given to RN and Post -op Vital signs reviewed and stable  Post vital signs: Reviewed and stable  Last Vitals:  Vitals:   07/31/16 1247 07/31/16 1301  BP: (!) 102/36   Pulse: (!) 134 (!) 124  Resp: (!) 31 19  Temp: 37.8 C     Last Pain:  Vitals:   07/31/16 1301  TempSrc:   PainSc: 0-No pain      Patients Stated Pain Goal: 0 (07/31/16 1008)  Complications: No apparent anesthesia complications

## 2016-07-31 NOTE — Anesthesia Procedure Notes (Signed)
Procedure Name: Intubation Date/Time: 07/31/2016 11:53 AM Performed by: Jennette Bill Pre-anesthesia Checklist: Patient identified, Emergency Drugs available, Suction available, Patient being monitored and Timeout performed Patient Re-evaluated:Patient Re-evaluated prior to inductionOxygen Delivery Method: Circle system utilized Preoxygenation: Pre-oxygenation with 100% oxygen Intubation Type: IV induction Laryngoscope Size: Mac and 3 Grade View: Grade II Tube type: Oral Tube size: 7.0 mm Number of attempts: 1 Airway Equipment and Method: Stylet Placement Confirmation: ETT inserted through vocal cords under direct vision,  positive ETCO2,  CO2 detector and breath sounds checked- equal and bilateral Secured at: 21 cm Tube secured with: Tape Dental Injury: Teeth and Oropharynx as per pre-operative assessment

## 2016-08-01 ENCOUNTER — Encounter: Payer: Self-pay | Admitting: Gastroenterology

## 2016-08-01 LAB — COMPREHENSIVE METABOLIC PANEL
ALT: 69 U/L — AB (ref 14–54)
ANION GAP: 5 (ref 5–15)
AST: 15 U/L (ref 15–41)
Albumin: 2.3 g/dL — ABNORMAL LOW (ref 3.5–5.0)
Alkaline Phosphatase: 272 U/L — ABNORMAL HIGH (ref 38–126)
BUN: 8 mg/dL (ref 6–20)
CALCIUM: 8.3 mg/dL — AB (ref 8.9–10.3)
CO2: 28 mmol/L (ref 22–32)
CREATININE: 0.51 mg/dL (ref 0.44–1.00)
Chloride: 107 mmol/L (ref 101–111)
GFR calc Af Amer: >60 mL/min (ref >60)
GLUCOSE: 80 mg/dL (ref 65–99)
Potassium: 3.5 mmol/L (ref 3.5–5.1)
SODIUM: 140 mmol/L (ref 135–145)
TOTAL PROTEIN: 5.9 g/dL — AB (ref 6.5–8.1)
Total Bilirubin: 1.6 mg/dL — ABNORMAL HIGH (ref 0.3–1.2)

## 2016-08-01 LAB — CBC WITH DIFFERENTIAL/PLATELET
BASOS ABS: 0 10*3/uL (ref 0–0.1)
BASOS PCT: 0 %
EOS ABS: 0.1 10*3/uL (ref 0–0.7)
EOS PCT: 1 %
HCT: 29.3 % — ABNORMAL LOW (ref 35.0–47.0)
Hemoglobin: 9.7 g/dL — ABNORMAL LOW (ref 12.0–16.0)
LYMPHS PCT: 16 %
Lymphs Abs: 1.2 10*3/uL (ref 1.0–3.6)
MCH: 28.7 pg (ref 26.0–34.0)
MCHC: 33 g/dL (ref 32.0–36.0)
MCV: 86.9 fL (ref 80.0–100.0)
MONO ABS: 0.4 10*3/uL (ref 0.2–0.9)
Monocytes Relative: 5 %
Neutro Abs: 5.5 10*3/uL (ref 1.4–6.5)
Neutrophils Relative %: 78 %
Platelets: 239 10*3/uL (ref 150–440)
RBC: 3.37 MIL/uL — AB (ref 3.80–5.20)
RDW: 15 % — AB (ref 11.5–14.5)
WBC: 7.2 10*3/uL (ref 3.6–11.0)

## 2016-08-01 NOTE — Anesthesia Postprocedure Evaluation (Signed)
Anesthesia Post Note  Patient: Alexandra Thompson  Procedure(s) Performed: Procedure(s) (LRB): ENDOSCOPIC RETROGRADE CHOLANGIOPANCREATOGRAPHY (ERCP) WITH PROPOFOL (N/A)  Patient location during evaluation: PACU Anesthesia Type: General Level of consciousness: awake and alert Pain management: pain level controlled Vital Signs Assessment: post-procedure vital signs reviewed and stable Respiratory status: spontaneous breathing, nonlabored ventilation, respiratory function stable and patient connected to nasal cannula oxygen Cardiovascular status: blood pressure returned to baseline and stable Postop Assessment: no signs of nausea or vomiting Anesthetic complications: no     Last Vitals:  Vitals:   08/01/16 0441 08/01/16 1221  BP: (!) 114/51 110/68  Pulse: 93 95  Resp: 16 18  Temp: 36.8 C 36.8 C    Last Pain:  Vitals:   08/01/16 1221  TempSrc: Oral  PainSc:                  Lenard Simmer

## 2016-08-01 NOTE — Progress Notes (Signed)
1 Day Post-Op  Subjective: Patient feels better after ERCP and stent placement. Her abdominal pain is less and she is hungry she has no shortness of breath  Objective: Vital signs in last 24 hours: Temp:  [98.2 F (36.8 C)-101.3 F (38.5 C)] 98.2 F (36.8 C) (04/13 0441) Pulse Rate:  [79-134] 93 (04/13 0441) Resp:  [16-31] 16 (04/13 0441) BP: (100-117)/(36-70) 114/51 (04/13 0441) SpO2:  [93 %-100 %] 94 % (04/13 0441) Last BM Date: 07/31/16  Intake/Output from previous day: 04/12 0701 - 04/13 0700 In: 3866 [P.O.:720; I.V.:3146] Out: 0  Intake/Output this shift: Total I/O In: 1137 [I.V.:1137] Out: -   Physical exam:  Abdomen soft nontender wounds are dressed and cleaned.  Lab Results: CBC   Recent Labs  07/31/16 0617 08/01/16 0458  WBC 13.1* 7.2  HGB 11.1* 9.7*  HCT 34.0* 29.3*  PLT 256 239   BMET  Recent Labs  07/31/16 0617 08/01/16 0458  NA 135 140  K 4.2 3.5  CL 100* 107  CO2 31 28  GLUCOSE 98 80  BUN 13 8  CREATININE 0.61 0.51  CALCIUM 8.8* 8.3*   PT/INR No results for input(s): LABPROT, INR in the last 72 hours. ABG No results for input(s): PHART, HCO3 in the last 72 hours.  Invalid input(s): PCO2, PO2  Studies/Results: X-ray Chest Pa Or Ap  Result Date: 07/31/2016 CLINICAL DATA:  Hypoxia during surgery. Status post cholecystectomy July 25, 2016. Pregnancy 8 weeks ago. EXAM: CHEST 1 VIEW COMPARISON:  Chest radiograph November 26, 2010 FINDINGS: Cardiomediastinal silhouette is unremarkable for this low inspiratory examination with crowded vasculature markings. Patchy bibasilar airspace opacities and small LEFT pleural effusion. Opacified Trachea projects midline and there is no pneumothorax. Included soft tissue planes and osseous structures are non-suspicious. IMPRESSION: Bibasilar airspace opacities and small LEFT pleural effusion in this hypoventilatory radiograph. Electronically Signed   By: Awilda Metro M.D.   On: 07/31/2016 14:11     Anti-infectives: Anti-infectives    Start     Dose/Rate Route Frequency Ordered Stop   07/29/16 1230  Ampicillin-Sulbactam (UNASYN) 3 g in sodium chloride 0.9 % 100 mL IVPB     3 g 200 mL/hr over 30 Minutes Intravenous Every 6 hours 07/29/16 1217        Assessment/Plan: s/p Procedure(s): ENDOSCOPIC RETROGRADE CHOLANGIOPANCREATOGRAPHY (ERCP) WITH PROPOFOL   Labs are reviewed and improved. We'll advance diet and saline lock continue IV antibiotics and will repeat chest x-ray tomorrow morning and possibly discharge tomorrow on oral antibiotics  Lattie Haw, MD, FACS  08/01/2016

## 2016-08-02 ENCOUNTER — Inpatient Hospital Stay: Payer: Medicaid Other

## 2016-08-02 LAB — CBC WITH DIFFERENTIAL/PLATELET
BASOS ABS: 0 10*3/uL (ref 0–0.1)
BASOS PCT: 1 %
EOS ABS: 0.1 10*3/uL (ref 0–0.7)
EOS PCT: 2 %
HCT: 27.7 % — ABNORMAL LOW (ref 35.0–47.0)
Hemoglobin: 9.3 g/dL — ABNORMAL LOW (ref 12.0–16.0)
LYMPHS PCT: 28 %
Lymphs Abs: 1.6 10*3/uL (ref 1.0–3.6)
MCH: 28.9 pg (ref 26.0–34.0)
MCHC: 33.7 g/dL (ref 32.0–36.0)
MCV: 85.7 fL (ref 80.0–100.0)
MONO ABS: 0.3 10*3/uL (ref 0.2–0.9)
Monocytes Relative: 5 %
Neutro Abs: 3.7 10*3/uL (ref 1.4–6.5)
Neutrophils Relative %: 64 %
PLATELETS: 294 10*3/uL (ref 150–440)
RBC: 3.23 MIL/uL — AB (ref 3.80–5.20)
RDW: 15.1 % — AB (ref 11.5–14.5)
WBC: 5.8 10*3/uL (ref 3.6–11.0)

## 2016-08-02 LAB — COMPREHENSIVE METABOLIC PANEL
ALBUMIN: 2.3 g/dL — AB (ref 3.5–5.0)
ALT: 56 U/L — AB (ref 14–54)
AST: 16 U/L (ref 15–41)
Alkaline Phosphatase: 275 U/L — ABNORMAL HIGH (ref 38–126)
Anion gap: 5 (ref 5–15)
BUN: 8 mg/dL (ref 6–20)
CHLORIDE: 110 mmol/L (ref 101–111)
CO2: 27 mmol/L (ref 22–32)
CREATININE: 0.57 mg/dL (ref 0.44–1.00)
Calcium: 8.4 mg/dL — ABNORMAL LOW (ref 8.9–10.3)
GFR calc Af Amer: >60 mL/min (ref >60)
Glucose, Bld: 99 mg/dL (ref 65–99)
POTASSIUM: 3.1 mmol/L — AB (ref 3.5–5.1)
SODIUM: 142 mmol/L (ref 135–145)
TOTAL PROTEIN: 6.4 g/dL — AB (ref 6.5–8.1)
Total Bilirubin: 1.1 mg/dL (ref 0.3–1.2)

## 2016-08-02 MED ORDER — HYDROCODONE-ACETAMINOPHEN 5-300 MG PO TABS: 1.0000 | ORAL_TABLET | ORAL | 0 refills | Status: DC | PRN

## 2016-08-02 MED ORDER — AMOXICILLIN-POT CLAVULANATE 875-125 MG PO TABS: 1.0000 | ORAL_TABLET | Freq: Two times a day (BID) | ORAL | 1 refills | Status: DC

## 2016-08-02 NOTE — Discharge Instructions (Signed)
Resume home medications Regular diet Follow up Dr Servando Snare in 4 weeks and [facility] one week

## 2016-08-02 NOTE — Progress Notes (Signed)
08/02/2016 10:23 AM  BP 123/64 (BP Location: Right Arm)   Pulse (!) 101   Temp 99.5 F (37.5 C) (Oral)   Resp 18   Ht  (1.448 m)   Wt 79.4 kg (175 lb)   SpO2 96%   Breastfeeding? Yes Comment: Pt told to pump & dump for 48 hr post NM test  BMI 37.87 kg/m  Patient discharged per MD orders. Discharge instructions reviewed with patient and patient verbalized understanding. IV removed per policy. Prescriptions discussed and given to patient. Discharged via wheelchair escorted by auxilary.  Ron Parker, RN

## 2016-08-02 NOTE — Progress Notes (Signed)
2 Days Post-Op  Subjective: Patient feels much better now and in fact has no pain this morning no nausea or vomiting  Objective: Vital signs in last 24 hours: Temp:  [98.2 F (36.8 C)-99.5 F (37.5 C)] 99.5 F (37.5 C) (04/14 0512) Pulse Rate:  [95-104] 101 (04/14 0512) Resp:  [18] 18 (04/14 0512) BP: (110-126)/(58-68) 123/64 (04/14 0512) SpO2:  [94 %-98 %] 96 % (04/14 0512) Last BM Date: 08/01/16  Intake/Output from previous day: 04/13 0701 - 04/14 0700 In: 1137 [I.V.:1137] Out: -  Intake/Output this shift: No intake/output data recorded.  Physical exam:  No icterus no jaundice abdomen is soft nontender wounds are clean  Lab Results: CBC   Recent Labs  08/01/16 0458 08/02/16 0458  WBC 7.2 5.8  HGB 9.7* 9.3*  HCT 29.3* 27.7*  PLT 239 294   BMET  Recent Labs  08/01/16 0458 08/02/16 0458  NA 140 142  K 3.5 3.1*  CL 107 110  CO2 28 27  GLUCOSE 80 99  BUN 8 8  CREATININE 0.51 0.57  CALCIUM 8.3* 8.4*   PT/INR No results for input(s): LABPROT, INR in the last 72 hours. ABG No results for input(s): PHART, HCO3 in the last 72 hours.  Invalid input(s): PCO2, PO2  Studies/Results: X-ray Chest Pa Or Ap  Result Date: 07/31/2016 CLINICAL DATA:  Hypoxia during surgery. Status post cholecystectomy July 25, 2016. Pregnancy 8 weeks ago. EXAM: CHEST 1 VIEW COMPARISON:  Chest radiograph November 26, 2010 FINDINGS: Cardiomediastinal silhouette is unremarkable for this low inspiratory examination with crowded vasculature markings. Patchy bibasilar airspace opacities and small LEFT pleural effusion. Opacified Trachea projects midline and there is no pneumothorax. Included soft tissue planes and osseous structures are non-suspicious. IMPRESSION: Bibasilar airspace opacities and small LEFT pleural effusion in this hypoventilatory radiograph. Electronically Signed   By: Awilda Metro M.D.   On: 07/31/2016 14:11    Anti-infectives: Anti-infectives    Start     Dose/Rate  Route Frequency Ordered Stop   08/02/16 0000  amoxicillin-clavulanate (AUGMENTIN) 875-125 MG tablet     1 tablet Oral 2 times daily 08/02/16 0743     07/29/16 1230  Ampicillin-Sulbactam (UNASYN) 3 g in sodium chloride 0.9 % 100 mL IVPB     3 g 200 mL/hr over 30 Minutes Intravenous Every 6 hours 07/29/16 1217        Assessment/Plan: s/p Procedure(s): ENDOSCOPIC RETROGRADE CHOLANGIOPANCREATOGRAPHY (ERCP) WITH PROPOFOL   Overall patient is improved and her pain is nonexistent at this point she can be discharged in stable condition on oral antibiotics and pain medication. She will follow up with [facility] surgical next week and with Dr. Servando Snare in 4 weeks  Lattie Haw, MD, [facility]  08/02/2016

## 2016-08-02 NOTE — Discharge Summary (Signed)
Physician Discharge Summary  Patient ID: Alexandra Thompson MRN: [id number] DOB/AGE: 04-26-1990 25 y.o.  Admit date: 07/29/2016 Discharge date: 08/02/2016   Discharge Diagnoses:  Active Problems:   Fistula of bile duct   Leakage of common bile duct   Procedures:ERCP and stent placement  Hospital Course: This patient status post laparoscopic cholecystectomy who presented with signs of a bile leak. Dr. Servando Snare was consult it from GI and he performed an ERCP with stent placement. Patient now is pain-free and her liver function tests are improving she has no pain and no fevers or chills and will be discharged in stable condition to keep her appointment with Dr. Tonita Cong next week from [facility] surgical and 2 see Dr. Servando Snare in 4 weeks for stent removal. She is given oral antibiotics and pain medication.  Consults: Dr. Servando Snare  Disposition: 01-Home or Self Care   Allergies as of 08/02/2016   No Known Allergies     Medication List    TAKE these medications   acetaminophen 325 MG tablet Commonly known as:  TYLENOL Take 325 mg by mouth every 6 (six) hours as needed for headache.   amoxicillin-clavulanate 875-125 MG tablet Commonly known as:  AUGMENTIN Take 1 tablet by mouth 2 (two) times daily.   ferrous sulfate 325 (65 FE) MG tablet Take 1 tablet (325 mg total) by mouth 2 (two) times daily with a meal.   Hydrocodone-Acetaminophen 5-300 MG Tabs Commonly known as:  VICODIN Take 1 tablet by mouth every 4 (four) hours as needed.   ibuprofen 600 MG tablet Commonly known as:  ADVIL,MOTRIN Take 1 tablet (600 mg total) by mouth every 6 (six) hours as needed.   oxyCODONE-acetaminophen 5-325 MG tablet Commonly known as:  PERCOCET/ROXICET Take 1 tablet by mouth every 6 (six) hours as needed for severe pain (pain scale 4-7).   senna-docusate 8.6-50 MG tablet Commonly known as:  Senokot-S Take 2 tablets by mouth daily.      Follow-up Information    [facility]  Follow up.   Specialty:  General Surgery Contact information: [address] [place] [address] [phone]       Midge Minium, MD Follow up in 4 week(s).   Specialty:  Gastroenterology Contact information: [address] [place]  [place] [address] [phone]           Lattie Haw, MD, FACS

## 2016-08-05 DIAGNOSIS — O09299 Supervision of pregnancy with other poor reproductive or obstetric history, unspecified trimester: Secondary | ICD-10-CM | POA: Insufficient documentation

## 2016-08-11 ENCOUNTER — Encounter: Payer: Self-pay | Admitting: General Surgery

## 2016-08-11 ENCOUNTER — Ambulatory Visit (INDEPENDENT_AMBULATORY_CARE_PROVIDER_SITE_OTHER): Payer: Medicaid Other | Admitting: General Surgery

## 2016-08-11 VITALS — BP 127/75 | HR 81 | Temp 98.3°F | Ht 62.0 in | Wt 170.0 lb

## 2016-08-11 DIAGNOSIS — Z4889 Encounter for other specified surgical aftercare: Secondary | ICD-10-CM

## 2016-08-11 NOTE — Progress Notes (Signed)
Outpatient Surgical Follow Up  08/11/2016  Alexandra Thompson is an 26 y.o. female.   Chief Complaint  Patient presents with  . Routine Post Op    Laparoscopic Cholecystectomy 07/25/2016 Dr. Tonita Cong    HPI: 26 year old female returns to clinic for follow-up from a laparoscopic cholecystectomy that was complicated by a postoperative bile leak from accessory duct. She was recently discharged from the hospital after an ERCP. She reports that her pain has completely resolved. Her appetite is returning normal and she is having normal bowel function. She denies any fevers or chills and states she is doing much better.  Past Medical History:  Diagnosis Date  . Medical history non-contributory     Past Surgical History:  Procedure Laterality Date  . CESAREAN SECTION    . CESAREAN SECTION WITH BILATERAL TUBAL LIGATION Bilateral 06/09/2016   Procedure: CESAREAN SECTION WITH BILATERAL TUBAL LIGATION;  Surgeon: Suzy Bouchard, MD;  Location: [facility] ORS;  Service: Obstetrics;  Laterality: Bilateral;  Baby Boy @ 1032 Apgars: 9/9 Weight: 9lb 15oz  . CHOLECYSTECTOMY N/A 07/25/2016   Procedure: LAPAROSCOPIC CHOLECYSTECTOMY WITH INTRAOPERATIVE CHOLANGIOGRAM;  Surgeon: Ricarda Frame, MD;  Location: [facility] ORS;  Service: General;  Laterality: N/A;  . ENDOSCOPIC RETROGRADE CHOLANGIOPANCREATOGRAPHY (ERCP) WITH PROPOFOL N/A 07/31/2016   Procedure: ENDOSCOPIC RETROGRADE CHOLANGIOPANCREATOGRAPHY (ERCP) WITH PROPOFOL;  Surgeon: Midge Minium, MD;  Location: [facility] ENDOSCOPY;  Service: Endoscopy;  Laterality: N/A;  . OOPHORECTOMY Left 06/09/2016   Procedure: OOPHORECTOMY;  Surgeon: Suzy Bouchard, MD;  Location: [facility] ORS;  Service: Gynecology;  Laterality: Left;    Family History  Problem Relation Age of Onset  . Cancer Neg Hx     Social History:  reports that she has never smoked. She has never used smokeless tobacco. She reports that she does not drink alcohol or use drugs.  Allergies: No Known  Allergies  Medications reviewed.    ROS A multipoint review of systems was completed, all pertinent positives and negatives are documented within the history of present illness the remainder are negative   BP 127/75   Pulse 81   Temp 98.3 F (36.8 C) (Oral)   Ht  (1.575 m)   Wt 77.1 kg (170 lb)   LMP 09/04/2015   Breastfeeding? No   BMI 31.09 kg/m   Physical Exam On: No acute distress Chest: Clear to auscultation Heart: Regular rhythm Abdomen: Soft, nontender, nondistended. Well approximated laparoscopic incision sites on the evidence of erythema or drainage.    No results found for this or any previous visit (from the past 48 hour(s)). No results found.  Assessment/Plan:  1. Aftercare following surgery 26 year old female status post laparoscopic cholecystectomy followed by ERCP for bile leak. Doing very well. No signs of surgical site infection on exam today. Discussed signs and symptoms of infection and to return to clinic medially should they occur. Otherwise she'll follow up in GI for removal of her common duct stent in the coming weeks. All questions answered to the patient's satisfaction and she'll follow up on an as-needed basis.     Ricarda Frame, MD FACS General Surgeon  08/11/2016,10:43 AM

## 2016-08-11 NOTE — Patient Instructions (Signed)
Please call our office with any questions or concerns.  Please do not submerge in a tub, hot tub, or pool until incisions are completely sealed.  Use sun block to incision area over the next year if this area will be exposed to sun. This helps decrease scarring.  You may resume your normal activities on 09/05/2016. At that time- Listen to your body when lifting, if you have pain when lifting, stop and then try again in a few days. Pain after doing exercises or activities of daily living is normal as you get back in to your normal routine.  If you develop redness, drainage, or pain at incision sites- call our office immediately and speak with a nurse.  

## 2016-09-12 ENCOUNTER — Other Ambulatory Visit: Payer: Self-pay

## 2016-09-12 DIAGNOSIS — K838 Other specified diseases of biliary tract: Secondary | ICD-10-CM

## 2016-10-06 ENCOUNTER — Encounter: Payer: Self-pay | Admitting: *Deleted

## 2016-10-07 ENCOUNTER — Ambulatory Visit: Payer: Self-pay | Admitting: Registered Nurse

## 2016-10-07 ENCOUNTER — Encounter: Payer: Self-pay | Admitting: Anesthesiology

## 2016-10-07 ENCOUNTER — Ambulatory Visit: Payer: Self-pay

## 2016-10-07 ENCOUNTER — Encounter
Admission: RE | Disposition: A | Discharge status: Home / Self Care | Payer: Self-pay | Source: Ambulatory Visit | Attending: Gastroenterology

## 2016-10-07 ENCOUNTER — Ambulatory Visit
Admission: RE | Admit: 2016-10-07 | Discharge: 2016-10-07 | Disposition: A | Discharge status: Home / Self Care | Payer: Medicaid Other | Source: Ambulatory Visit | Attending: Gastroenterology | Admitting: Gastroenterology

## 2016-10-07 DIAGNOSIS — K8050 Calculus of bile duct without cholangitis or cholecystitis without obstruction: Secondary | ICD-10-CM | POA: Insufficient documentation

## 2016-10-07 DIAGNOSIS — K838 Other specified diseases of biliary tract: Secondary | ICD-10-CM

## 2016-10-07 DIAGNOSIS — Z4659 Encounter for fitting and adjustment of other gastrointestinal appliance and device: Secondary | ICD-10-CM | POA: Insufficient documentation

## 2016-10-07 HISTORY — PX: ERCP: SHX5425

## 2016-10-07 LAB — POCT PREGNANCY, URINE: PREG TEST UR: NEGATIVE

## 2016-10-07 SURGERY — ERCP, WITH INTERVENTION IF INDICATED
Anesthesia: General

## 2016-10-07 MED ORDER — LIDOCAINE HCL (PF) 2 % IJ SOLN
INTRAMUSCULAR | Status: AC
  Filled 2016-10-07: qty 2

## 2016-10-07 MED ORDER — LIDOCAINE HCL (CARDIAC) 20 MG/ML IV SOLN
INTRAVENOUS | Status: DC | PRN
  Administered 2016-10-07: 60 mg via INTRAVENOUS

## 2016-10-07 MED ORDER — GLYCOPYRROLATE 0.2 MG/ML IJ SOLN
INTRAMUSCULAR | Status: DC | PRN
  Administered 2016-10-07: .2 mg via INTRAVENOUS

## 2016-10-07 MED ORDER — PROPOFOL 500 MG/50ML IV EMUL
INTRAVENOUS | Status: AC
  Filled 2016-10-07: qty 50

## 2016-10-07 MED ORDER — SODIUM CHLORIDE 0.9 % IV SOLN
INTRAVENOUS | Status: DC
  Administered 2016-10-07: 1000 mL via INTRAVENOUS

## 2016-10-07 MED ORDER — PROPOFOL 500 MG/50ML IV EMUL
INTRAVENOUS | Status: DC | PRN
  Administered 2016-10-07: 160 ug/kg/min via INTRAVENOUS

## 2016-10-07 MED ORDER — FENTANYL CITRATE (PF) 100 MCG/2ML IJ SOLN
INTRAMUSCULAR | Status: AC
  Filled 2016-10-07: qty 2

## 2016-10-07 MED ORDER — FENTANYL CITRATE (PF) 100 MCG/2ML IJ SOLN
INTRAMUSCULAR | Status: DC | PRN
  Administered 2016-10-07: 50 ug via INTRAVENOUS

## 2016-10-07 MED ORDER — MIDAZOLAM HCL 2 MG/2ML IJ SOLN
INTRAMUSCULAR | Status: DC | PRN
  Administered 2016-10-07: 1 mg via INTRAVENOUS

## 2016-10-07 MED ORDER — MIDAZOLAM HCL 2 MG/2ML IJ SOLN
INTRAMUSCULAR | Status: AC
  Filled 2016-10-07: qty 2

## 2016-10-07 MED ORDER — PROPOFOL 10 MG/ML IV BOLUS
INTRAVENOUS | Status: DC | PRN
  Administered 2016-10-07: 70 mg via INTRAVENOUS

## 2016-10-07 NOTE — Op Note (Addendum)
[facility] Patient Name: Alexandra Thompson Procedure Date: 10/07/2016 12:30 PM MRN: [id number] Account #: [id number] Date of Birth: April 17, 1991 Admit Type: Inpatient Age: 26 Room: [facility] ENDO ROOM 4 Gender: Female Note Status: Finalized Procedure:            ERCP Indications:          Stent removal after bile duct leak Providers:            Midge Minium MD, MD Medicines:            Propofol per Anesthesia Complications:        No immediate complications. Procedure:            Pre-Anesthesia Assessment:                       - Prior to the procedure, a History and Physical was                        performed, and patient medications and allergies were                        reviewed. The patient's tolerance of previous                        anesthesia was also reviewed. The risks and benefits of                        the procedure and the sedation options and risks were                        discussed with the patient. All questions were                        answered, and informed consent was obtained. Prior                        Anticoagulants: The patient has taken no previous                        anticoagulant or antiplatelet agents. ASA Grade                        Assessment: II - A patient with mild systemic disease.                        After reviewing the risks and benefits, the patient was                        deemed in satisfactory condition to undergo the                        procedure.                       After obtaining informed consent, the scope was passed                        under direct vision. Throughout the procedure, the                        patient's blood  pressure, pulse, and oxygen saturations                        were monitored continuously. The Endoscope was                        introduced through the mouth, and used to inject                        contrast into and used to inject contrast into the bile                         duct. The ERCP was accomplished without difficulty. The                        patient tolerated the procedure well. Findings:      A scout film of the abdomen was obtained. Surgical clips were seen in       the area of the cystic duct. One stent ending in the main bile duct was       seen. One plastic stent originating in the biliary tree was emerging       from the major papilla. One stent was removed from the common bile duct       using a snare. A wire was passed into the biliary tree. The biliary tree       was swept with a 15 mm balloon starting at the bifurcation. Four stones       were removed. No stones remained. Impression:           - One stent from the biliary tree was seen in the major                        papilla.                       - Choledocholithiasis was found. Complete removal was                        accomplished by balloon extraction.                       - One stent was removed from the common bile duct.                       - The biliary tree was swept. Recommendation:       - Watch for pancreatitis, bleeding, perforation, and                        cholangitis. Procedure Code(s):    --- Professional ---                       [phone], Endoscopic retrograde cholangiopancreatography                        (ERCP); with removal of foreign body(s) or stent(s)                        from biliary/pancreatic duct(s)                       43264, Endoscopic  retrograde cholangiopancreatography                        (ERCP); with removal of calculi/debris from                        biliary/pancreatic duct(s) Diagnosis Code(s):    --- Professional ---                       K80.50, Calculus of bile duct without cholangitis or                        cholecystitis without obstruction                       Z96.89, Presence of other specified functional implants                       Z46.59, Encounter for fitting and adjustment of other                         gastrointestinal appliance and device CPT copyright 2016 American Medical Association. All rights reserved. The codes documented in this report are preliminary and upon coder review may  be revised to meet current compliance requirements. [name] Jodie Cavey MD, MD 10/07/2016 12:53:50 PM This report has been signed electronically. Number of Addenda: 0 Note Initiated On: 10/07/2016 12:30 PM      [facility]

## 2016-10-07 NOTE — Anesthesia Post-op Follow-up Note (Cosign Needed)
Anesthesia QCDR form completed.        

## 2016-10-07 NOTE — Anesthesia Preprocedure Evaluation (Signed)
Anesthesia Evaluation  Patient identified by MRN, date of birth, ID band Patient awake    Reviewed: Allergy & Precautions, H&P , NPO status , Patient's Chart, lab work & pertinent test results, reviewed documented beta blocker date and time   History of Anesthesia Complications Negative for: history of anesthetic complications  Airway Mallampati: II  TM Distance: >3 FB Neck ROM: full    Dental   Pulmonary neg pulmonary ROS,           Cardiovascular Exercise Tolerance: Good negative cardio ROS       Neuro/Psych negative neurological ROS  negative psych ROS   GI/Hepatic negative GI ROS, Neg liver ROS,   Endo/Other  negative endocrine ROS  Renal/GU negative Renal ROS  negative genitourinary   Musculoskeletal   Abdominal   Peds  Hematology negative hematology ROS (+)   Anesthesia Other Findings Past Medical History: No date: Medical history non-contributory   Reproductive/Obstetrics negative OB ROS                             Anesthesia Physical  Anesthesia Plan  ASA: I  Anesthesia Plan: General   Post-op Pain Management:    Induction:   PONV Risk Score and Plan:   Airway Management Planned:   Additional Equipment:   Intra-op Plan:   Post-operative Plan:   Informed Consent: I have reviewed the patients History and Physical, chart, labs and discussed the procedure including the risks, benefits and alternatives for the proposed anesthesia with the patient or authorized representative who has indicated his/her understanding and acceptance.   Dental Advisory Given  Plan Discussed with: Anesthesiologist, CRNA and Surgeon  Anesthesia Plan Comments:         Anesthesia Quick Evaluation

## 2016-10-07 NOTE — Anesthesia Postprocedure Evaluation (Signed)
Anesthesia Post Note  Patient: Alexandra PeltMariela Thompson  Procedure(s) Performed: Procedure(s) (LRB): ENDOSCOPIC RETROGRADE CHOLANGIOPANCREATOGRAPHY (ERCP) stent removal (N/A)  Patient location during evaluation: Endoscopy Anesthesia Type: General Level of consciousness: awake and alert Pain management: pain level controlled Vital Signs Assessment: post-procedure vital signs reviewed and stable Respiratory status: spontaneous breathing, nonlabored ventilation, respiratory function stable and patient connected to nasal cannula oxygen Cardiovascular status: blood pressure returned to baseline and stable Postop Assessment: no signs of nausea or vomiting Anesthetic complications: no     Last Vitals:  Vitals:   10/07/16 1315 10/07/16 1325  BP: 105/62 (!) 106/58  Pulse: 75 75  Resp:    Temp:      Last Pain:  Vitals:   10/07/16 1255  TempSrc: Tympanic                 Lenard SimmerAndrew Aaryanna Hyden

## 2016-10-07 NOTE — H&P (Signed)
   [name] Tierrah Anastos, MD [facility] [address] [place], [place] [address] [phone] Fax : [phone]  Primary Care Physician:  [facility] Primary Gastroenterologist:  Dr. Servando SnareWohl  Pre-Procedure History & Physical: HPI:  Alexandra PeltMariela Cafiero is a 26 y.o. female is here for an ERCP.   Past Medical History:  Diagnosis Date  . Medical history non-contributory     Past Surgical History:  Procedure Laterality Date  . CESAREAN SECTION    . CESAREAN SECTION WITH BILATERAL TUBAL LIGATION Bilateral 06/09/2016   Procedure: CESAREAN SECTION WITH BILATERAL TUBAL LIGATION;  Surgeon: Suzy Bouchardhomas J Schermerhorn, MD;  Location: [facility] ORS;  Service: Obstetrics;  Laterality: Bilateral;  Baby Boy @ 1032 Apgars: 9/9 Weight: 9lb 15oz  . CHOLECYSTECTOMY N/A 07/25/2016   Procedure: LAPAROSCOPIC CHOLECYSTECTOMY WITH INTRAOPERATIVE CHOLANGIOGRAM;  Surgeon: Ricarda Frameharles Woodham, MD;  Location: [facility] ORS;  Service: General;  Laterality: N/A;  . ENDOSCOPIC RETROGRADE CHOLANGIOPANCREATOGRAPHY (ERCP) WITH PROPOFOL N/A 07/31/2016   Procedure: ENDOSCOPIC RETROGRADE CHOLANGIOPANCREATOGRAPHY (ERCP) WITH PROPOFOL;  Surgeon: [name] Xayden Linsey, MD;  Location: [facility] ENDOSCOPY;  Service: Endoscopy;  Laterality: N/A;  . OOPHORECTOMY Left 06/09/2016   Procedure: OOPHORECTOMY;  Surgeon: Suzy Bouchardhomas J Schermerhorn, MD;  Location: [facility] ORS;  Service: Gynecology;  Laterality: Left;    Prior to Admission medications   Not on File    Allergies as of 09/12/2016  . (No Known Allergies)    Family History  Problem Relation Age of Onset  . Cancer Neg Hx     Social History   Social History  . Marital status: Married    Spouse name: N/A  . Number of children: N/A  . Years of education: N/A   Occupational History  . Not on file.   Social History Main Topics  . Smoking status: Never Smoker  . Smokeless tobacco: Never Used  . Alcohol use No  . Drug use: No  . Sexual activity: Yes    Birth control/ protection:  Surgical   Other Topics Concern  . Not on file   Social History Narrative  . No narrative on file    Review of Systems: See HPI, otherwise negative ROS  Physical Exam: There were no vitals taken for this visit. General:   Alert,  pleasant and cooperative in NAD Head:  Normocephalic and atraumatic. Neck:  Supple; no masses or thyromegaly. Lungs:  Clear throughout to auscultation.    Heart:  Regular rate and rhythm. Abdomen:  Soft, nontender and nondistended. Normal bowel sounds, without guarding, and without rebound.   Neurologic:  Alert and  oriented x4;  grossly normal neurologically.  Impression/Plan: Alexandra Thompson is here for an ERCP to be performed for stent removal.  Risks, benefits, limitations, and alternatives regarding  ERCP have been reviewed with the patient.  Questions have been answered.  All parties agreeable.   [name] Higinio Grow, MD  10/07/2016, 10:37 AM

## 2016-10-07 NOTE — Transfer of Care (Signed)
Immediate Anesthesia Transfer of Care Note  Patient: Alexandra Thompson  Procedure(s) Performed: Procedure(s): ENDOSCOPIC RETROGRADE CHOLANGIOPANCREATOGRAPHY (ERCP) stent removal (N/A)  Patient Location: PACU  Anesthesia Type:General  Level of Consciousness: awake, alert  and oriented  Airway & Oxygen Therapy: Patient Spontanous Breathing and Patient connected to nasal cannula oxygen  Post-op Assessment: Report given to RN and Post -op Vital signs reviewed and stable  Post vital signs: Reviewed and stable  Last Vitals:  Vitals:   10/07/16 1039 10/07/16 1255  BP: 107/70 106/62  Pulse: 71 81  Resp: 16 (!) 22  Temp: (!) 36 C 36.2 C    Last Pain:  Vitals:   10/07/16 1255  TempSrc: Tympanic         Complications: No apparent anesthesia complications

## 2016-10-08 ENCOUNTER — Encounter: Payer: Self-pay | Admitting: Gastroenterology

## 2017-01-24 IMAGING — US US OB COMP +14 WK
1 series · 13 of 28 positions shown · non-contrast
Comparison: none

CLINICAL DATA: Third trimester, anatomic scan, [REDACTED] weeks has
size greater than dates.

EXAM:
OBSTETRICAL ULTRASOUND >14 WKS

[Series 1: us ob comp +14 wk · 0.28mm/px · 13 of 112 slices shown]
[im 5/112]
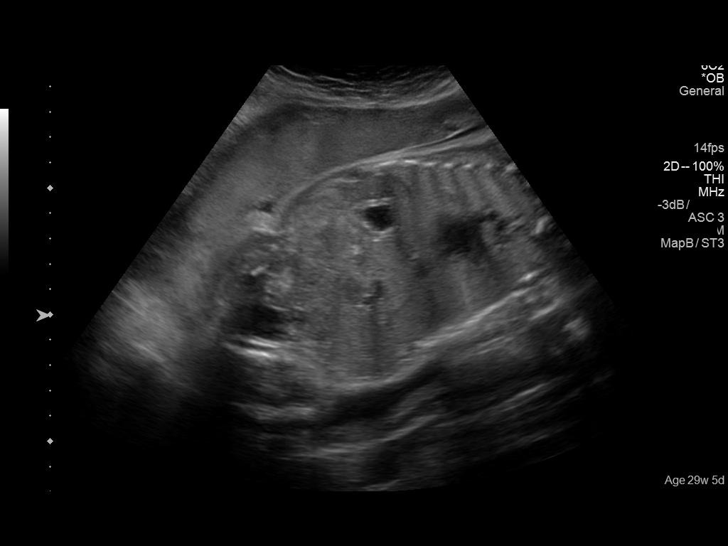
[im 13/112]
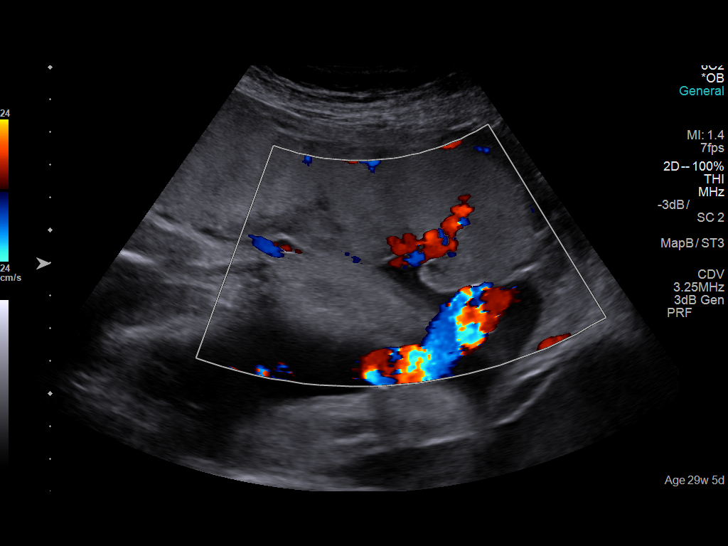
[im 21/112]
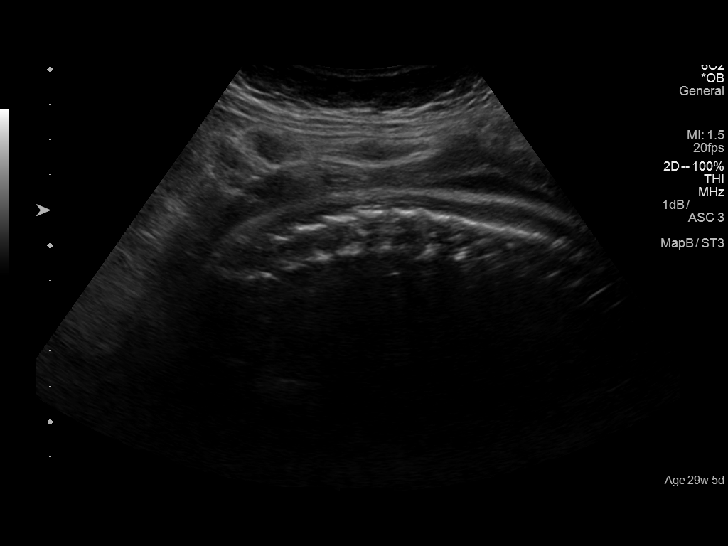
[im 29/112]
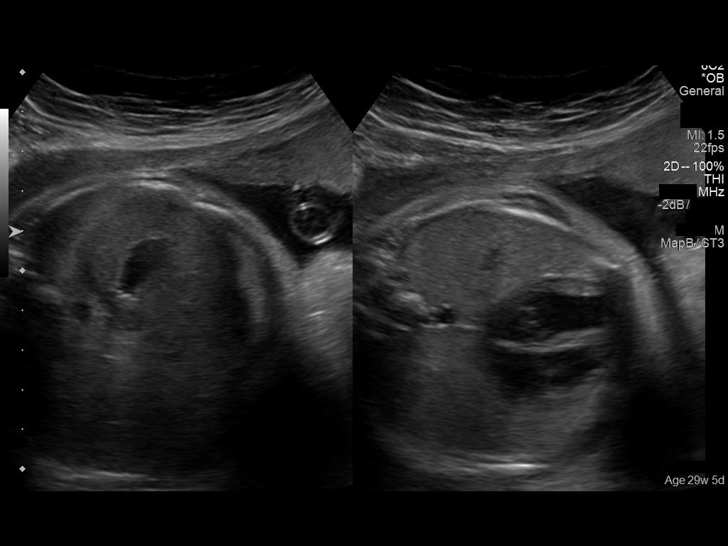
[im 38/112]
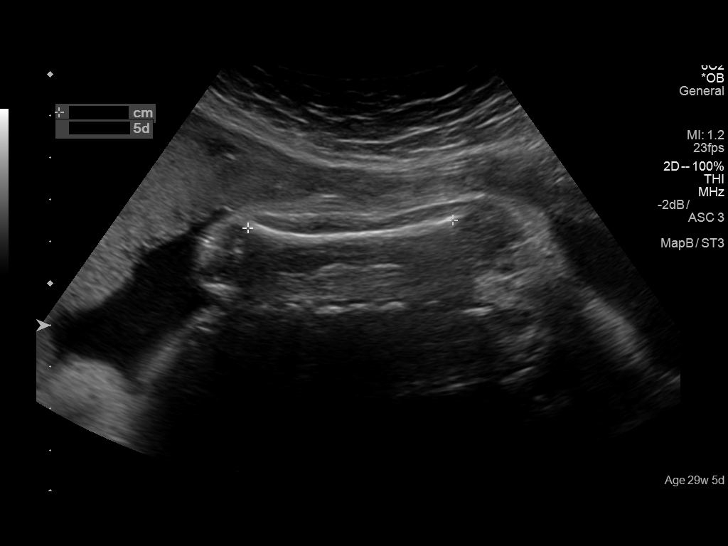
[im 46/112]
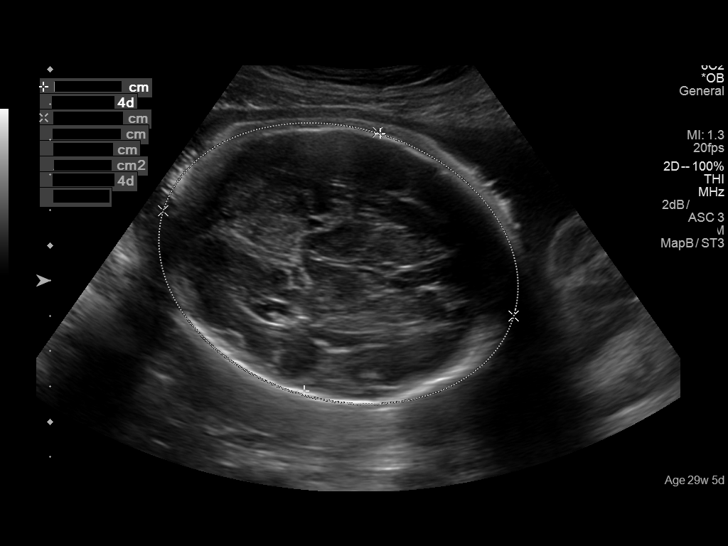
[im 58/112]
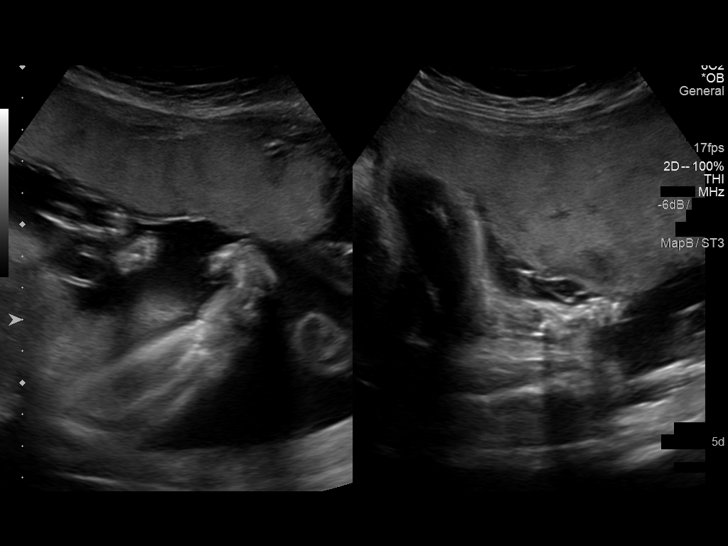
[im 66/112]
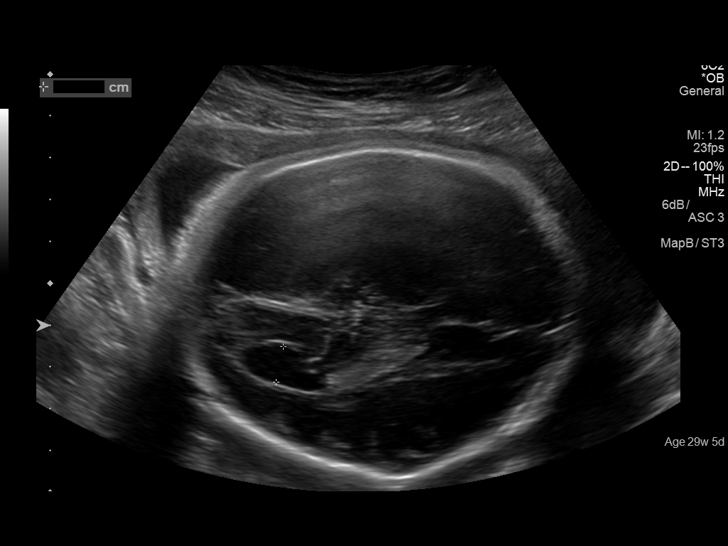
[im 75/112]
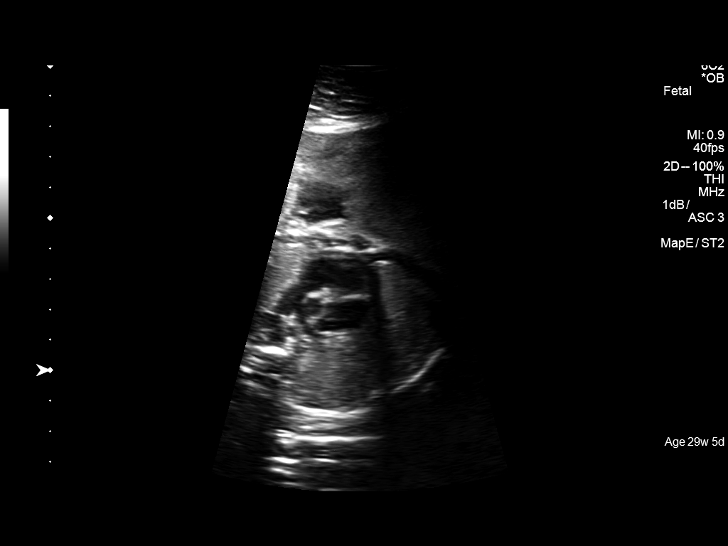
[im 83/112]
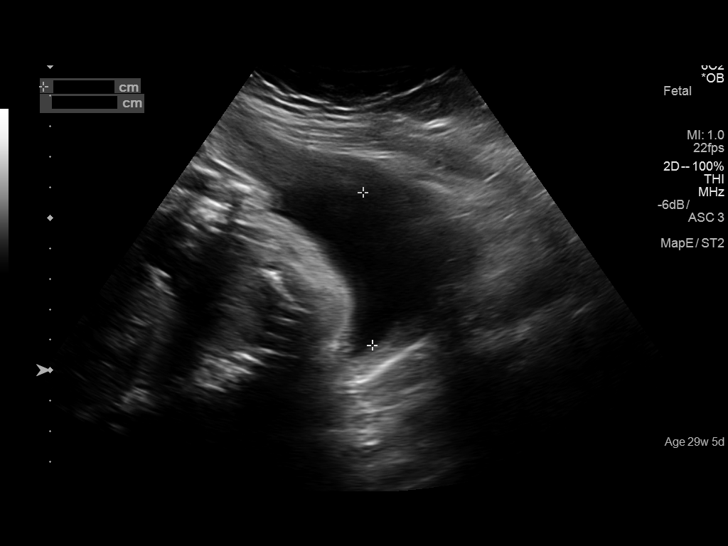
[im 91/112]
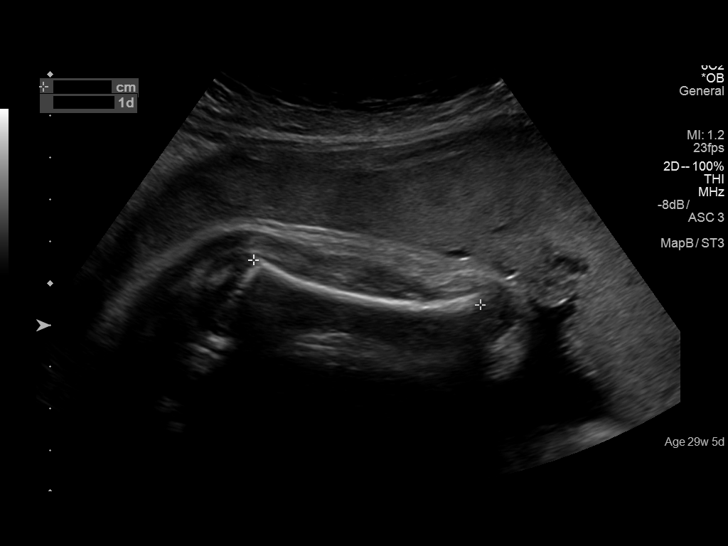
[im 99/112]
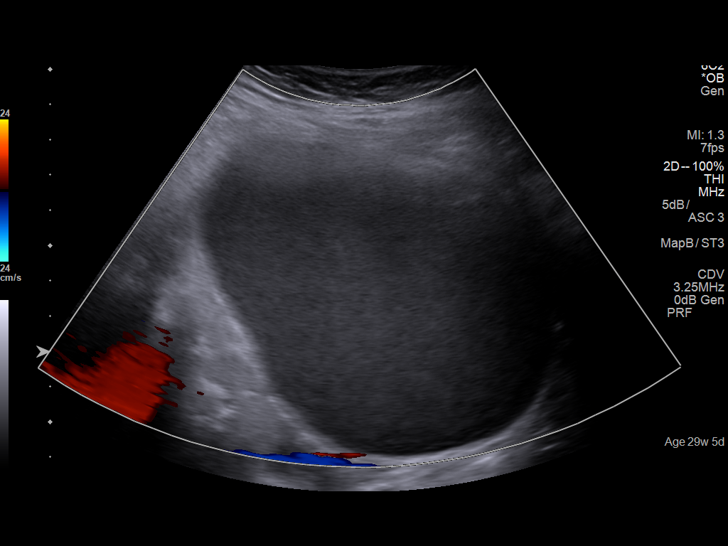
[im 107/112]
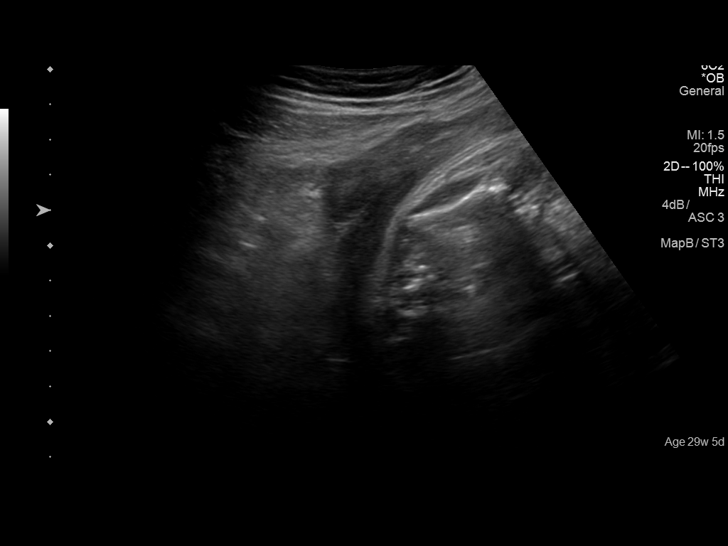

[13 of 28 positions shown; findings below may reference images not displayed]

FINDINGS: Number of Fetuses: 1

Heart Rate:  165 bpm

Movement: Present

Presentation: Cephalic-oblique

Previa: None

Placental Location: Fundal

Amniotic Fluid (Subjective): Normal

Amniotic Fluid (Objective):

Vertical pocket 5.0cm

AFI 16.3 cm (5%ile= 8.4 cm, 95%= 23.4 cm for 29 wks)

FETAL BIOMETRY

BPD:  7.59cm 30w 3d

HC:    28.2cm  30w   6d

AC:   26.6cm  30w   5d

FL:   5.45cm  28w   6d

Current Mean GA: 29w 6d              US EDC: June 13, 2016

Estimated Fetal Weight:  1516g    52%ile

FETAL ANATOMY

Lateral Ventricles: Visualized

Thalami/CSP: Visualized

Posterior Fossa:  Visualized

Upper Lip: Visualized

Spine: Suboptimally visualized due to fetal lie

4 Chamber Heart on Left: Visualized

LVOT: Visualized

RVOT: Visualized

Stomach on Left: Visualized

3 Vessel Cord: Visualized

Cord Insertion site: Visualized

Kidneys: Visualized

Bladder: Visualized

Extremities: Visualized

Sex: Male genitalia

Technically difficult due to: Fetal lie and maternal body habitus

Maternal Findings: There is a large complex appearing
solid-appearing structure in the left adnexal region line lateral
and superior to the left ovary. It measures 10.4 x 8.2 x 9.2 cm. It
exhibits no internal vascularity. The patient denied tenderness in
this region during exam.

Cervix:  4.8 cm in length and closed
IMPRESSION: Single viable IUP with cephalic-oblique presentation with a fundal
placenta. No evidence of placenta previa. The amniotic fluid volume
is estimated to be normal. The estimated gestational age is 29 weeks
6 days with estimated date of confinement Monday June, 2016. No
fetal anomalies are observed.

Large solid-appearing fairly homogeneous mass in the left adnexal
region measuring 10.4 x 8.2 x 9.2 cm. It exhibits a ground-glass
appearance with no significant vascularity. It was nontender during
the exam. This likely reflects an endometrioma. Correlation with
clinical and laboratory values will be needed. Pelvic MRI could be
considered if further imaging is felt indicated.

## 2019-02-10 IMAGING — CR DG CHEST 2V
2 series · 3 of 3 positions shown · non-contrast
Comparison: Chest x-ray a 07/31/2016.

CLINICAL DATA: 25-year-old female with history of cholecystectomy
on 07/25/2016, presenting with pain.

EXAM:
CHEST  2 VIEW

[Series 1: chest pa · 0.14mm/px · 2 of 2 slices shown]
[im 1/2]
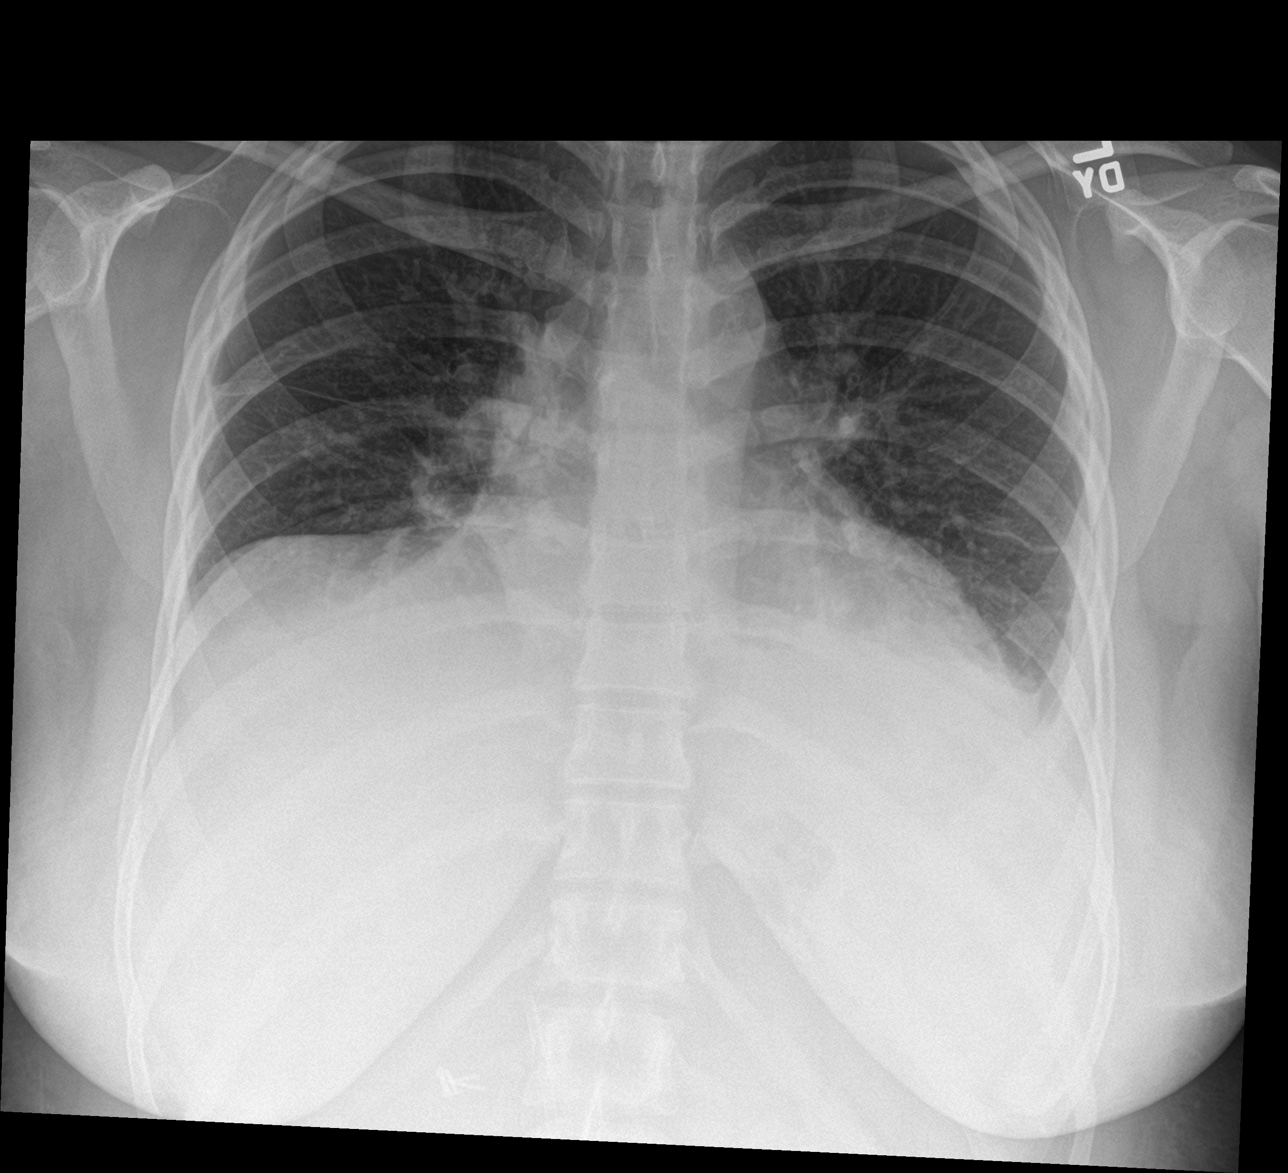
[im 2/2]
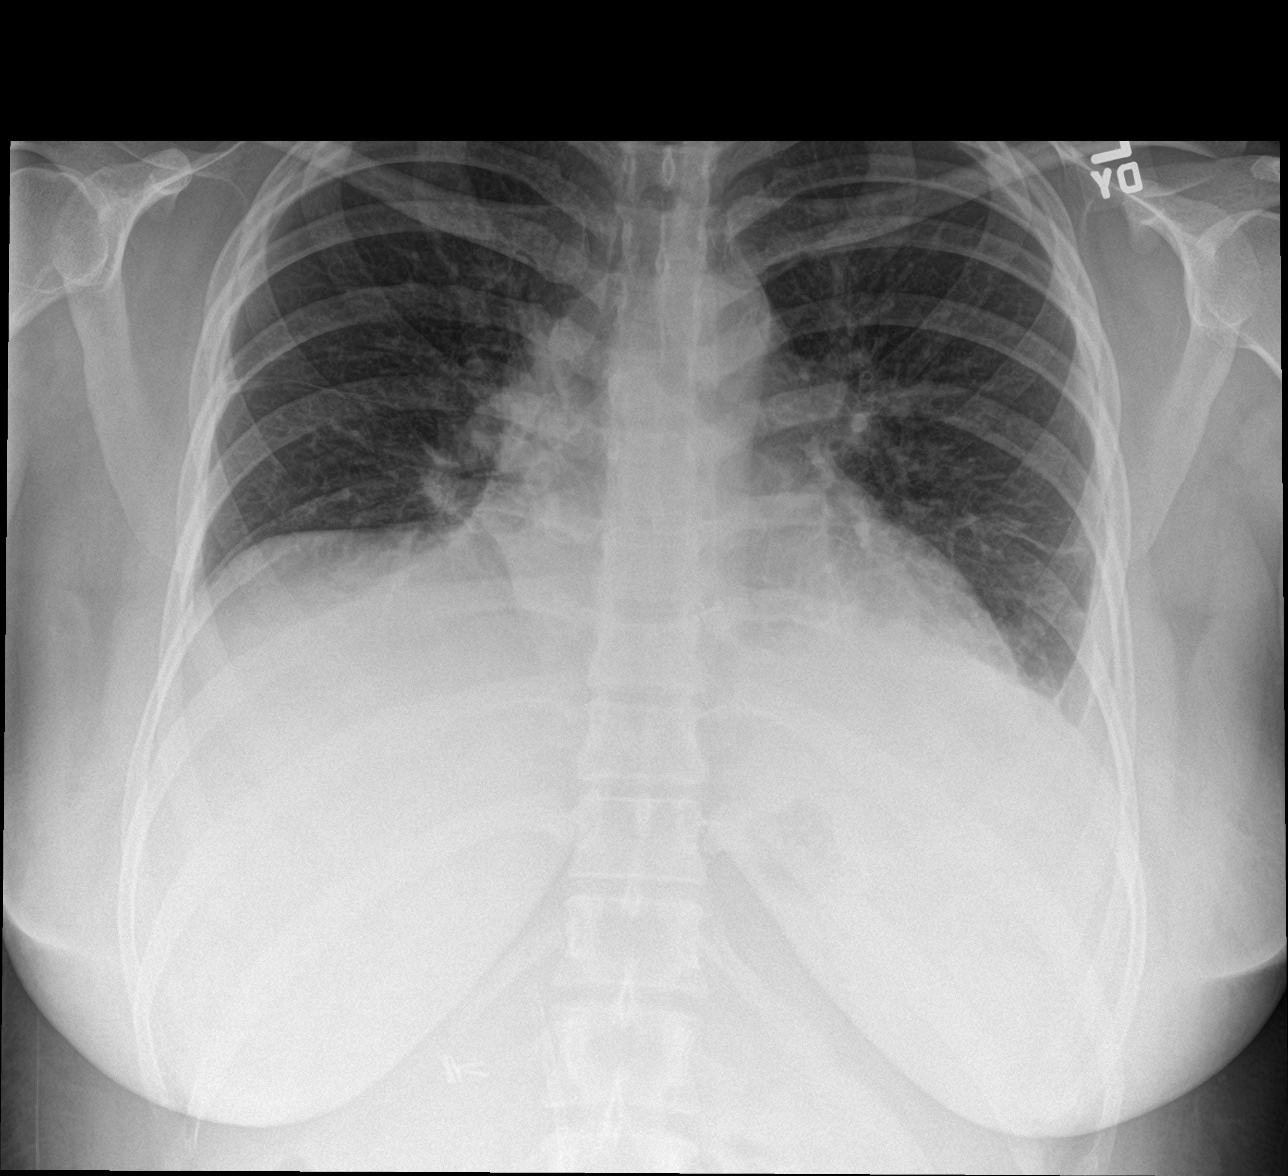

[chest lat]
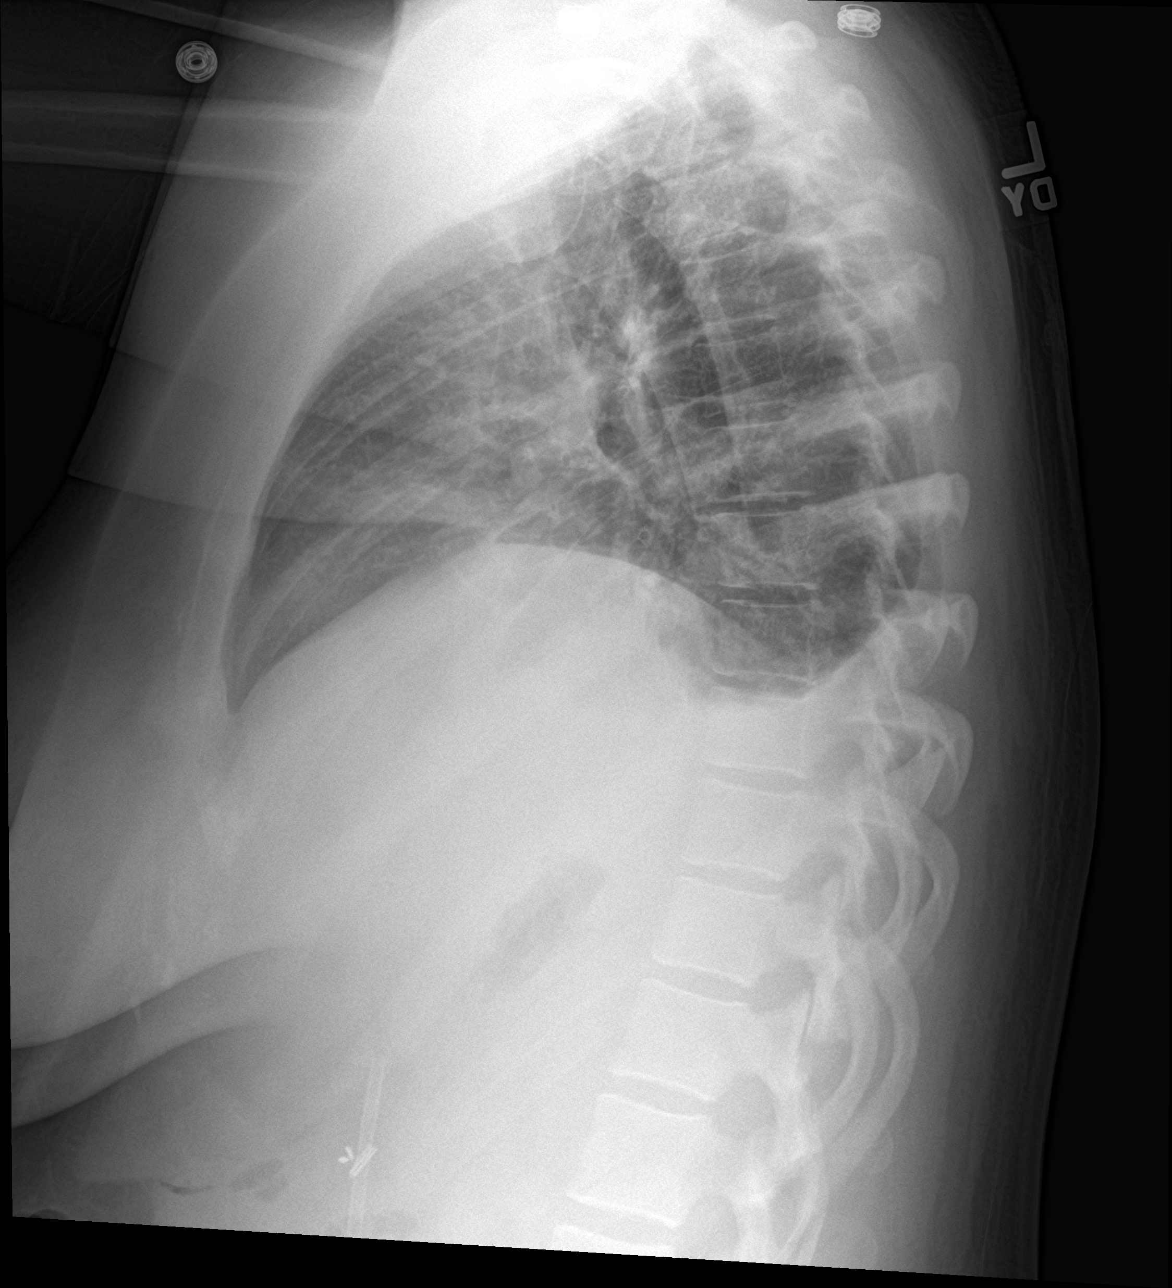

[3 of 3 positions shown; findings below may reference images not displayed]

FINDINGS: Lung volumes are low. Small bilateral pleural effusions. Bibasilar
opacities favored to reflect some passive subsegmental atelectasis.
No evidence of pulmonary edema. Heart size is normal. Mediastinal
contours are within normal limits. Surgical clips project over the
right upper quadrant of the abdomen, compatible with the reported
clinical history of cholecystectomy.
IMPRESSION: 1. Small bilateral pleural effusions.
2. Bibasilar opacities favored to reflect areas of postoperative
subsegmental atelectasis.
3. Low lung volumes.

## 2021-07-05 ENCOUNTER — Other Ambulatory Visit: Payer: Self-pay

## 2021-07-05 ENCOUNTER — Other Ambulatory Visit (HOSPITAL_COMMUNITY)
Admission: RE | Admit: 2021-07-05 | Discharge: 2021-07-05 | Disposition: A | Discharge status: Home / Self Care | Payer: Medicaid Other | Source: Ambulatory Visit | Attending: Obstetrics | Admitting: Obstetrics

## 2021-07-05 ENCOUNTER — Encounter: Payer: Self-pay | Admitting: Obstetrics

## 2021-07-05 ENCOUNTER — Ambulatory Visit (INDEPENDENT_AMBULATORY_CARE_PROVIDER_SITE_OTHER): Payer: Medicaid Other | Admitting: Obstetrics

## 2021-07-05 VITALS — BP 125/80 | Ht <= 58 in | Wt 184.0 lb

## 2021-07-05 DIAGNOSIS — Z01419 Encounter for gynecological examination (general) (routine) without abnormal findings: Secondary | ICD-10-CM

## 2021-07-05 DIAGNOSIS — Z113 Encounter for screening for infections with a predominantly sexual mode of transmission: Secondary | ICD-10-CM | POA: Diagnosis not present

## 2021-07-05 DIAGNOSIS — Z124 Encounter for screening for malignant neoplasm of cervix: Secondary | ICD-10-CM

## 2021-07-05 NOTE — Progress Notes (Signed)
SUBJECTIVE ? ?HPI ? ?Alexandra Thompson is a 31 y.o.-year-old female who presents for an annual physical and Pap today today. She has regular monthly period and has had a BTL. She denies any unusual vaginal bleeding, discharge, and pelvic pain. She is sexually active with multiple partners and would like STI screening today. She reports weekly headaches with aura, photosensitivity, and nausea. She is also interested in trying to lose weight. She has a history of c/s with oophorectomy, gallbladder removal, and peptic ulcers.  ? ?Medical/Surgical History ?Past Medical History:  ?Diagnosis Date  ? Medical history non-contributory   ? ?Past Surgical History:  ?Procedure Laterality Date  ? CESAREAN SECTION    ? CESAREAN SECTION WITH BILATERAL TUBAL LIGATION Bilateral 06/09/2016  ? Procedure: CESAREAN SECTION WITH BILATERAL TUBAL LIGATION;  Surgeon: Suzy Bouchard, MD;  Location: [facility] ORS;  Service: Obstetrics;  Laterality: Bilateral;  Baby Boy @ 1032 ?Apgars: 9/9 ?Weight: 9lb 15oz  ? CHOLECYSTECTOMY N/A 07/25/2016  ? Procedure: LAPAROSCOPIC CHOLECYSTECTOMY WITH INTRAOPERATIVE CHOLANGIOGRAM;  Surgeon: Ricarda Frame, MD;  Location: [facility] ORS;  Service: General;  Laterality: N/A;  ? ENDOSCOPIC RETROGRADE CHOLANGIOPANCREATOGRAPHY (ERCP) WITH PROPOFOL N/A 07/31/2016  ? Procedure: ENDOSCOPIC RETROGRADE CHOLANGIOPANCREATOGRAPHY (ERCP) WITH PROPOFOL;  Surgeon: Midge Minium, MD;  Location: [facility] ENDOSCOPY;  Service: Endoscopy;  Laterality: N/A;  ? ERCP N/A 10/07/2016  ? Procedure: ENDOSCOPIC RETROGRADE CHOLANGIOPANCREATOGRAPHY (ERCP) stent removal;  Surgeon: Midge Minium, MD;  Location: [facility] END[facility];  Service: Endoscopy;  Laterality: N/A;  ? OOPHORECTOMY Left 06/09/2016  ? Procedure: OOPHORECTOMY;  Surgeon: Suzy Bouchard, MD;  Location: [facility] ORS;  Service: Gynecology;  Laterality: Left;  ? TUBAL LIGATION    ? ? 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 ?Obstetric History ?OB History   ? ? Gravida  ?5  ? Para  ?3  ? Term  ?3  ? Preterm  ?   ? AB  ?2  ? Living  ?3  ?  ? ? SAB  ?2  ? IAB  ?   ? Ectopic  ?   ? Multiple  ?0  ? Live Births  ?3  ?   ?  ?  ?  ? ?GYN/Menstrual History ?Patient's last menstrual period was 06/27/2021 (exact date). ?regular periods every month ?Last Pap: 4 years ago, abnormal ?Contraception: BTL ? ?Prevention ?Encouraged dental and eye exams ?Mammogram at 40 ?Up-to-date on vaccines ? ?Current Medications ?No outpatient medications prior to visit.  ? ?No facility-administered medications prior to visit.  ?  ? ? ?ROS ?History obtained from the patient ?General ROS: negative for - chills, fatigue, or night sweats ?Psychological ROS: negative for - anxiety or depression ?Ophthalmic ROS: negative for - decreased vision ?Endocrine ROS: negative for - breast changes, palpitations, or polydipsia/polyuria ?Breast ROS: negative for breast lumps ?Respiratory ROS: no cough, shortness of breath, or wheezing ?Cardiovascular ROS: no chest pain or dyspnea on exertion ?Gastrointestinal ROS: no abdominal pain, change in bowel habits, or black or bloody stools ?Genito-Urinary ROS: no dysuria, trouble voiding, or hematuria ?Musculoskeletal ROS: negative ?Dermatological ROS: negative ? ?No flowsheet data found.  ? ?OBJECTIVE ? ?Last Weight  Most recent update: 07/05/2021  2:36 PM  ? ? Weight  ?83.5 kg (184 lb)  ?      ? ?  ?  ?Body mass index is 38.46 kg/m?.  ? ? ?BP 125/80   Ht 4\' 10"  (1.473 m)  Wt 184 lb (83.5 kg)   LMP 06/27/2021 (Exact Date)   BMI 38.46 kg/m?  ?General appearance: alert, cooperative, and appears stated age ?Head: Normocephalic, without obvious abnormality, atraumatic ?Eyes: negative findings: lids and lashes normal and conjunctivae and sclerae normal ?Neck: no adenopathy, supple, symmetrical, trachea midline, and thyroid not enlarged, symmetric, no  tenderness/mass/nodules ?Lungs: clear to auscultation bilaterally ?Breasts: normal appearance, no masses or tenderness, Inspection negative, No nipple discharge or bleeding, No axillary or supraclavicular adenopathy, Normal to palpation without dominant masses ?Heart: regular rate and rhythm, S1, S2 normal, no murmur, click, rub or gallop ?Abdomen: soft, non-tender; bowel sounds normal; no masses,  no organomegaly ?Pelvic: cervix normal in appearance, external genitalia normal, no adnexal masses or tenderness, no cervical motion tenderness, rectovaginal septum normal, uterus normal size, shape, and consistency, and vagina normal without discharge. Pap collected. ?Extremities: extremities normal, atraumatic, no cyanosis or edema ?Pulses: 2+ and symmetric ?Skin: Skin color, texture, turgor normal. No rashes or lesions ?Lymph nodes: Cervical, supraclavicular, and axillary nodes normal. ? ?ASSESSMENT  ?1) Annual exam ?2) Pap due ?3) Headaches ?4) Interested in weight loss ? ?PLAN ?1) Physical exam as noted. CBC, TSH, A1C, lipid panel drawn. STI screening done. ?2) Pap collected. F/u based on results. ?3) Discussed headache journal to identify triggers, comfort measures ?4) Reviewed exercise and current diet. Encouraged increasing protein, whole grains, and vegetables in addition to regular exercise. Referral to Doreene Burke, CNM for weight loss. ? ?Return in one year for annual visit or PRN. ? ?Guadlupe Spanish, CNM  ?

## 2021-07-06 LAB — CBC WITH DIFFERENTIAL/PLATELET
Basophils Absolute: 0 10*3/uL (ref 0.0–0.2)
Basos: 0 %
EOS (ABSOLUTE): 0.1 10*3/uL (ref 0.0–0.4)
Eos: 1 %
Hematocrit: 41 % (ref 34.0–46.6)
Hemoglobin: 13.7 g/dL (ref 11.1–15.9)
Immature Grans (Abs): 0 10*3/uL (ref 0.0–0.1)
Immature Granulocytes: 0 %
Lymphocytes Absolute: 2.6 10*3/uL (ref 0.7–3.1)
Lymphs: 30 %
MCH: 28.8 pg (ref 26.6–33.0)
MCHC: 33.4 g/dL (ref 31.5–35.7)
MCV: 86 fL (ref 79–97)
Monocytes Absolute: 0.4 10*3/uL (ref 0.1–0.9)
Monocytes: 4 %
Neutrophils Absolute: 5.6 10*3/uL (ref 1.4–7.0)
Neutrophils: 65 %
Platelets: 261 10*3/uL (ref 150–450)
RBC: 4.75 x10E6/uL (ref 3.77–5.28)
RDW: 13.2 % (ref 11.7–15.4)
WBC: 8.8 10*3/uL (ref 3.4–10.8)

## 2021-07-06 LAB — LIPID PANEL
Chol/HDL Ratio: 4.5 ratio — ABNORMAL HIGH (ref 0.0–4.4)
Cholesterol, Total: 195 mg/dL (ref 100–199)
HDL: 43 mg/dL (ref >39)
LDL Chol Calc (NIH): 128 mg/dL — ABNORMAL HIGH (ref 0–99)
Triglycerides: 131 mg/dL (ref 0–149)
VLDL Cholesterol Cal: 24 mg/dL (ref 5–40)

## 2021-07-06 LAB — HEMOGLOBIN A1C
Est. average glucose Bld gHb Est-mCnc: 114 mg/dL
Hgb A1c MFr Bld: 5.6 % (ref 4.8–5.6)

## 2021-07-06 LAB — TSH: TSH: 1.28 u[IU]/mL (ref 0.450–4.500)

## 2021-07-09 ENCOUNTER — Encounter: Payer: Self-pay | Admitting: Obstetrics

## 2021-07-09 ENCOUNTER — Other Ambulatory Visit: Payer: Self-pay | Admitting: Obstetrics

## 2021-07-09 LAB — CYTOLOGY - PAP
Adequacy: ABSENT
Chlamydia: POSITIVE — AB
Comment: NEGATIVE
Comment: NEGATIVE
Comment: NEGATIVE
Comment: NORMAL
Diagnosis: NEGATIVE
High risk HPV: NEGATIVE
Neisseria Gonorrhea: NEGATIVE
Trichomonas: NEGATIVE

## 2021-07-09 MED ORDER — DOXYCYCLINE MONOHYDRATE 100 MG PO TABS: 100.0000 mg | ORAL_TABLET | Freq: Two times a day (BID) | ORAL | 0 refills | Status: AC

## 2021-07-09 NOTE — Progress Notes (Signed)
+   chlamydia. Rx for doxycycline 100 mg PO BID x 7 days sent to pharmacy. Blakelee notified via MyChart. ? ? ?M. Chryl Heck, CNM ?

## 2021-07-15 ENCOUNTER — Ambulatory Visit (INDEPENDENT_AMBULATORY_CARE_PROVIDER_SITE_OTHER): Payer: Self-pay | Admitting: Certified Nurse Midwife

## 2021-07-15 ENCOUNTER — Encounter: Payer: Self-pay | Admitting: Certified Nurse Midwife

## 2021-07-15 ENCOUNTER — Other Ambulatory Visit: Payer: Self-pay

## 2021-07-15 VITALS — BP 114/78 | HR 76 | Ht <= 58 in | Wt 185.1 lb

## 2021-07-15 DIAGNOSIS — Z7689 Persons encountering health services in other specified circumstances: Secondary | ICD-10-CM

## 2021-07-15 DIAGNOSIS — E669 Obesity, unspecified: Secondary | ICD-10-CM

## 2021-07-15 MED ORDER — CYANOCOBALAMIN 1000 MCG/ML IJ SOLN: 1000.0000 ug | Freq: Once | INTRAMUSCULAR | 5 refills | Status: AC

## 2021-07-15 MED ORDER — PHENTERMINE HCL 37.5 MG PO TABS: 37.5000 mg | ORAL_TABLET | Freq: Every day | ORAL | 0 refills | Status: DC

## 2021-07-15 MED ORDER — CYANOCOBALAMIN 1000 MCG/ML IJ SOLN
1000.0000 ug | Freq: Once | INTRAMUSCULAR | Status: AC
  Administered 2021-07-15: 1000 ug via INTRAMUSCULAR

## 2021-07-15 NOTE — Progress Notes (Signed)
Subjective:  ?Alexandra Thompson is a 31 y.o. [phone] at Unknown being seen today for weight loss management- initial visit.  Patient reports General ROS: negative and reports previous weight loss attempts: ? ?Onset was gradual over the past 5 years with child rearing and becoming a single parent. Marland Kitchen  ?Onset followed:   change in living environment,  ?Associated symptoms include: fatigue, depression, , change in clothing fit. ? ? Pertinent medical history includes:none ? Risk factors include:single parent of 3 children ? The patient has a surgical history of: none ?Pertinent social history includes:none ? Todays evaluation has included: metabolic profile, hemoglobin A1c, thyroid panel  and lipid profile  ?Past treatment has included: small frequent feedings, exercise.  ? ?The following portions of the patient's history were reviewed and updated as appropriate: allergies, current medications, past family history, past medical history, past social history, past surgical history and problem list.  ? ?Objective:  ? ?Vitals:  ? 07/15/21 1415  ?BP: 114/78  ?Pulse: 76  ?Weight: 185 lb 1.6 oz (84 kg)  ?Height: 4\' 10"  (1.473 m)  ?Waist:38.25 ? ?General:  Alert, oriented and cooperative. Patient is in no acute distress.  ?PE: Well groomed female in no current distress,   ?Mental Status: Normal mood and affect. Normal behavior. Normal judgment and thought content.  ? ?Current BMI: Body mass index is 38.69 kg/m?. ? ? ?Assessment and Plan:  ?Obesity ? ?There are no diagnoses linked to this encounter. ? ?Plan: low carb, High protein diet ?RX for adipex 37.5 mg daily and B12 .ml monthly, to start now with first injection given at today's visit. Reviewed side-effects common to both medications and expected outcomes. ?Increase daily water intake to at least 8 bottle a day, every day. ? ? ?PMP aware website ?Patients found but no prescriptions found. ?We were able to find this patient. However, there are no prescription records  within the prescription fill dates provided. Please try a longer date range. ? ?Controlled substance agreement completed.  ? ?Goal is to reduse weight by  5% ( 9.25 lbs) by end of three months, and will re-evaluate then. ? ?RTC in 4 weeks for visit to check weight & BP, and get next B12 injections. ? ? ? ?Please refer to After Visit Summary for other counseling recommendations.  ? ? ? , CNM ? ? ? ?Consider the Low Glycemic Index Diet and 6 smaller meals daily .  This boosts your metabolism and regulates your sugars: ? ? ?Use the protein bar by Atkins because they have lots of fiber in them ? ?Find the low carb flatbreads, tortillas and pita breads for sandwiches: ? ?[name] makes a pita bread and a flat bread , available at [name] and [name]; ?[name] makes a low carb flatbread available at [name] and [employer] that is 9 net carbs and 100 cal ?Mission makes a low carb whole wheat tortilla available at [name] stores with 6 net carbs and 210 cal ? ?[name] yogurt can still have a lot of carbs .  Dannon Light N fit has 80 cal and 8 carbs  ?  ?

## 2021-07-15 NOTE — Patient Instructions (Signed)

## 2021-07-16 LAB — HEMOGLOBIN A1C
Est. average glucose Bld gHb Est-mCnc: 114 mg/dL
Hgb A1c MFr Bld: 5.6 % (ref 4.8–5.6)

## 2021-07-16 LAB — LIPID PANEL
Chol/HDL Ratio: 4.2 ratio (ref 0.0–4.4)
Cholesterol, Total: 148 mg/dL (ref 100–199)
HDL: 35 mg/dL — ABNORMAL LOW (ref >39)
LDL Chol Calc (NIH): 85 mg/dL (ref 0–99)
Triglycerides: 162 mg/dL — ABNORMAL HIGH (ref 0–149)
VLDL Cholesterol Cal: 28 mg/dL (ref 5–40)

## 2021-07-16 LAB — THYROID PANEL WITH TSH
Free Thyroxine Index: 2 (ref 1.2–4.9)
T3 Uptake Ratio: 28 % (ref 24–39)
T4, Total: 7.1 ug/dL (ref 4.5–12.0)
TSH: 1.55 u[IU]/mL (ref 0.450–4.500)

## 2021-07-16 LAB — COMPREHENSIVE METABOLIC PANEL
ALT: 14 IU/L (ref 0–32)
AST: 12 IU/L (ref 0–40)
Albumin/Globulin Ratio: 1.3 (ref 1.2–2.2)
Albumin: 3.9 g/dL (ref 3.9–5.0)
Alkaline Phosphatase: 105 IU/L (ref 44–121)
BUN/Creatinine Ratio: 17 (ref 9–23)
BUN: 14 mg/dL (ref 6–20)
Bilirubin Total: <0.2 mg/dL (ref 0.0–1.2)
CO2: 22 mmol/L (ref 20–29)
Calcium: 9.2 mg/dL (ref 8.7–10.2)
Chloride: 103 mmol/L (ref 96–106)
Creatinine, Ser: 0.81 mg/dL (ref 0.57–1.00)
Globulin, Total: 3.1 g/dL (ref 1.5–4.5)
Glucose: 97 mg/dL (ref 70–99)
Potassium: 3.8 mmol/L (ref 3.5–5.2)
Sodium: 137 mmol/L (ref 134–144)
Total Protein: 7 g/dL (ref 6.0–8.5)
eGFR: 100 mL/min/{1.73_m2} (ref >59)

## 2021-08-12 ENCOUNTER — Encounter: Payer: Medicaid Other | Admitting: Certified Nurse Midwife

## 2021-08-13 ENCOUNTER — Ambulatory Visit (INDEPENDENT_AMBULATORY_CARE_PROVIDER_SITE_OTHER): Payer: Self-pay | Admitting: Certified Nurse Midwife

## 2021-08-13 ENCOUNTER — Encounter: Payer: Self-pay | Admitting: Certified Nurse Midwife

## 2021-08-13 VITALS — BP 128/83 | HR 92 | Ht <= 58 in | Wt 182.2 lb

## 2021-08-13 DIAGNOSIS — E669 Obesity, unspecified: Secondary | ICD-10-CM

## 2021-08-13 DIAGNOSIS — Z7689 Persons encountering health services in other specified circumstances: Secondary | ICD-10-CM

## 2021-08-13 MED ORDER — CYANOCOBALAMIN 1000 MCG/ML IJ SOLN
1000.0000 ug | Freq: Once | INTRAMUSCULAR | Status: AC
  Administered 2021-08-13: 1000 ug via INTRAMUSCULAR

## 2021-08-13 MED ORDER — CYANOCOBALAMIN 1000 MCG/ML IJ SOLN: 1000.0000 ug | Freq: Once | INTRAMUSCULAR | 5 refills | Status: AC

## 2021-08-13 MED ORDER — PHENTERMINE HCL 37.5 MG PO TABS: 37.5000 mg | ORAL_TABLET | Freq: Every day | ORAL | 0 refills | Status: DC

## 2021-08-13 NOTE — Patient Instructions (Signed)

## 2021-08-13 NOTE — Progress Notes (Signed)
SUBJECTIVE:  32 y.o. here for follow-up weight loss visit, previously seen 4 weeks ago. Denies any concerns and feels like medication is working well. She has lost 3 lbs this past month. She noted challenges with being a single mom of 3 and all her children are in sports with makes dinners difficult.  ? ?OBJECTIVE:  BP 128/83   Pulse 92   Ht 4\' 10"  (1.473 m)   Wt 182 lb 3.2 oz (82.6 kg)   BMI 38.08 kg/m?   Body mass index is 38.08 kg/m?Marland Kitchen Waist: 38.0 in ? ?Patient appears well. ?ASSESSMENT:  Obesity- responding well to weight loss plan ?PLAN:  To continue with current medications. B12 1081mcg/ml injection given ?Discussed meal planning  ?She is exercising 3-4 time a week x 30-40 min. Made some dietary changes.  ?RTC in 4 weeks as planned ? ?Philip Aspen, CNM  ?

## 2021-09-10 ENCOUNTER — Ambulatory Visit (INDEPENDENT_AMBULATORY_CARE_PROVIDER_SITE_OTHER): Payer: Self-pay | Admitting: Certified Nurse Midwife

## 2021-09-10 ENCOUNTER — Encounter: Payer: Self-pay | Admitting: Certified Nurse Midwife

## 2021-09-10 VITALS — BP 108/67 | HR 76 | Ht <= 58 in | Wt 180.7 lb

## 2021-09-10 DIAGNOSIS — Z7689 Persons encountering health services in other specified circumstances: Secondary | ICD-10-CM

## 2021-09-10 MED ORDER — CYANOCOBALAMIN 1000 MCG/ML IJ SOLN
1000.0000 ug | Freq: Once | INTRAMUSCULAR | Status: AC
  Administered 2021-09-10: 1000 ug via INTRAMUSCULAR

## 2021-09-10 MED ORDER — PHENTERMINE HCL 37.5 MG PO TABS: 37.5000 mg | ORAL_TABLET | Freq: Every day | ORAL | 0 refills | Status: DC

## 2021-09-10 NOTE — Progress Notes (Signed)
SUBJECTIVE:  31 y.o. here for follow-up weight loss visit, previously seen 4 weeks ago. Denies any concerns and feels like medication is helping. She lost 2 lbs this month. State this past month was busy, she started a new job and was remodeling her home so she was not as focused on her diet and exercise. Discussed meeting 5% goal on next visit. She will need to lose 4 lbs. Discussed exercise recommendations and diet .   OBJECTIVE:  BP 108/67   Pulse 76   Ht 4\' 10"  (1.473 m)   Wt 180 lb 11.2 oz (82 kg)   LMP  (LMP Unknown)   BMI 37.77 kg/m   Body mass index is 37.77 kg/m. Waist: 38 in . Patient appears well. ASSESSMENT:  Obesity- responding well to weight loss plan PLAN:  To continue with current medications. B12 1037mcg/ml injection given RTC in 4 weeks as planned  80m,  CNM

## 2021-09-10 NOTE — Patient Instructions (Signed)
Calorie Counting for Weight Loss Calories are units of energy. Your body needs a certain number of calories from food to keep going throughout the day. When you eat or drink more calories than your body needs, your body stores the extra calories mostly as fat. When you eat or drink fewer calories than your body needs, your body burns fat to get the energy it needs. Calorie counting means keeping track of how many calories you eat and drink each day. Calorie counting can be helpful if you need to lose weight. If you eat fewer calories than your body needs, you should lose weight. Ask your health care provider what a healthy weight is for you. For calorie counting to work, you will need to eat the right number of calories each day to lose a healthy amount of weight per week. A dietitian can help you figure out how many calories you need in a day and will suggest ways to reach your calorie goal. A healthy amount of weight to lose each week is usually 1-2 lb (0.5-0.9 kg). This usually means that your daily calorie intake should be reduced by 500-750 calories. Eating 1,200-1,500 calories a day can help most women lose weight. Eating 1,500-1,800 calories a day can help most men lose weight. What do I need to know about calorie counting? Work with your health care provider or dietitian to determine how many calories you should get each day. To meet your daily calorie goal, you will need to: Find out how many calories are in each food that you would like to eat. Try to do this before you eat. Decide how much of the food you plan to eat. Keep a food log. Do this by writing down what you ate and how many calories it had. To successfully lose weight, it is important to balance calorie counting with a healthy lifestyle that includes regular activity. Where do I find calorie information?  The number of calories in a food can be found on a Nutrition Facts label. If a food does not have a Nutrition Facts label, try  to look up the calories online or ask your dietitian for help. Remember that calories are listed per serving. If you choose to have more than one serving of a food, you will have to multiply the calories per serving by the number of servings you plan to eat. For example, the label on a package of bread might say that a serving size is 1 slice and that there are 90 calories in a serving. If you eat 1 slice, you will have eaten 90 calories. If you eat 2 slices, you will have eaten 180 calories. How do I keep a food log? After each time that you eat, record the following in your food log as soon as possible: What you ate. Be sure to include toppings, sauces, and other extras on the food. How much you ate. This can be measured in cups, ounces, or number of items. How many calories were in each food and drink. The total number of calories in the food you ate. Keep your food log near you, such as in a pocket-sized notebook or on an app or website on your mobile phone. Some programs will calculate calories for you and show you how many calories you have left to meet your daily goal. What are some portion-control tips? Know how many calories are in a serving. This will help you know how many servings you can have of a certain   food. Use a measuring cup to measure serving sizes. You could also try weighing out portions on a kitchen scale. With time, you will be able to estimate serving sizes for some foods. Take time to put servings of different foods on your favorite plates or in your favorite bowls and cups so you know what a serving looks like. Try not to eat straight from a food's packaging, such as from a bag or box. Eating straight from the package makes it hard to see how much you are eating and can lead to overeating. Put the amount you would like to eat in a cup or on a plate to make sure you are eating the right portion. Use smaller plates, glasses, and bowls for smaller portions and to prevent  overeating. Try not to multitask. For example, avoid watching TV or using your computer while eating. If it is time to eat, sit down at a table and enjoy your food. This will help you recognize when you are full. It will also help you be more mindful of what and how much you are eating. What are tips for following this plan? Reading food labels Check the calorie count compared with the serving size. The serving size may be smaller than what you are used to eating. Check the source of the calories. Try to choose foods that are high in protein, fiber, and vitamins, and low in saturated fat, trans fat, and sodium. Shopping Read nutrition labels while you shop. This will help you make healthy decisions about which foods to buy. Pay attention to nutrition labels for low-fat or fat-free foods. These foods sometimes have the same number of calories or more calories than the full-fat versions. They also often have added sugar, starch, or salt to make up for flavor that was removed with the fat. Make a grocery list of lower-calorie foods and stick to it. Cooking Try to cook your favorite foods in a healthier way. For example, try baking instead of frying. Use low-fat dairy products. Meal planning Use more fruits and vegetables. One-half of your plate should be fruits and vegetables. Include lean proteins, such as chicken, turkey, and fish. Lifestyle Each week, aim to do one of the following: 150 minutes of moderate exercise, such as walking. 75 minutes of vigorous exercise, such as running. General information Know how many calories are in the foods you eat most often. This will help you calculate calorie counts faster. Find a way of tracking calories that works for you. Get creative. Try different apps or programs if writing down calories does not work for you. What foods should I eat?  Eat nutritious foods. It is better to have a nutritious, high-calorie food, such as an avocado, than a food with  few nutrients, such as a bag of potato chips. Use your calories on foods and drinks that will fill you up and will not leave you hungry soon after eating. Examples of foods that fill you up are nuts and nut butters, vegetables, lean proteins, and high-fiber foods such as whole grains. High-fiber foods are foods with more than 5 g of fiber per serving. Pay attention to calories in drinks. Low-calorie drinks include water and unsweetened drinks. The items listed above may not be a complete list of foods and beverages you can eat. Contact a dietitian for more information. What foods should I limit? Limit foods or drinks that are not good sources of vitamins, minerals, or protein or that are high in unhealthy fats. These   include: Candy. Other sweets. Sodas, specialty coffee drinks, alcohol, and juice. The items listed above may not be a complete list of foods and beverages you should avoid. Contact a dietitian for more information. How do I count calories when eating out? Pay attention to portions. Often, portions are much larger when eating out. Try these tips to keep portions smaller: Consider sharing a meal instead of getting your own. If you get your own meal, eat only half of it. Before you start eating, ask for a container and put half of your meal into it. When available, consider ordering smaller portions from the menu instead of full portions. Pay attention to your food and drink choices. Knowing the way food is cooked and what is included with the meal can help you eat fewer calories. If calories are listed on the menu, choose the lower-calorie options. Choose dishes that include vegetables, fruits, whole grains, low-fat dairy products, and lean proteins. Choose items that are boiled, broiled, grilled, or steamed. Avoid items that are buttered, battered, fried, or served with cream sauce. Items labeled as crispy are usually fried, unless stated otherwise. Choose water, low-fat milk,  unsweetened iced tea, or other drinks without added sugar. If you want an alcoholic beverage, choose a lower-calorie option, such as a glass of wine or light beer. Ask for dressings, sauces, and syrups on the side. These are usually high in calories, so you should limit the amount you eat. If you want a salad, choose a garden salad and ask for grilled meats. Avoid extra toppings such as bacon, cheese, or fried items. Ask for the dressing on the side, or ask for olive oil and vinegar or lemon to use as dressing. Estimate how many servings of a food you are given. Knowing serving sizes will help you be aware of how much food you are eating at restaurants. Where to find more information Centers for Disease Control and Prevention: www.cdc.gov U.[place]. Department of Agriculture: myplate.gov Summary Calorie counting means keeping track of how many calories you eat and drink each day. If you eat fewer calories than your body needs, you should lose weight. A healthy amount of weight to lose per week is usually 1-2 lb (0.5-0.9 kg). This usually means reducing your daily calorie intake by 500-750 calories. The number of calories in a food can be found on a Nutrition Facts label. If a food does not have a Nutrition Facts label, try to look up the calories online or ask your dietitian for help. Use smaller plates, glasses, and bowls for smaller portions and to prevent overeating. Use your calories on foods and drinks that will fill you up and not leave you hungry shortly after a meal. This information is not intended to replace advice given to you by your health care provider. Make sure you discuss any questions you have with your health care provider. Document Revised: 05/19/2019 Document Reviewed: 05/19/2019 Elsevier Patient Education  2023 Elsevier Inc.  

## 2021-09-25 ENCOUNTER — Encounter: Payer: Medicaid Other | Admitting: Certified Nurse Midwife

## 2021-10-29 ENCOUNTER — Encounter: Payer: Medicaid Other | Admitting: Certified Nurse Midwife

## 2021-12-27 ENCOUNTER — Ambulatory Visit (INDEPENDENT_AMBULATORY_CARE_PROVIDER_SITE_OTHER): Payer: Self-pay | Admitting: Certified Nurse Midwife

## 2021-12-27 ENCOUNTER — Other Ambulatory Visit (HOSPITAL_COMMUNITY)
Admission: RE | Admit: 2021-12-27 | Discharge: 2021-12-27 | Disposition: A | Discharge status: Home / Self Care | Payer: Medicaid Other | Source: Ambulatory Visit | Attending: Obstetrics and Gynecology | Admitting: Obstetrics and Gynecology

## 2021-12-27 VITALS — BP 129/77 | HR 91 | Ht <= 58 in | Wt 186.0 lb

## 2021-12-27 DIAGNOSIS — R350 Frequency of micturition: Secondary | ICD-10-CM

## 2021-12-27 DIAGNOSIS — R102 Pelvic and perineal pain: Secondary | ICD-10-CM | POA: Diagnosis present

## 2021-12-27 LAB — POCT URINALYSIS DIPSTICK
Bilirubin, UA: NEGATIVE
Glucose, UA: NEGATIVE
Ketones, UA: NEGATIVE
Leukocytes, UA: NEGATIVE
Nitrite, UA: NEGATIVE
Protein, UA: NEGATIVE
Spec Grav, UA: 1.015 (ref 1.010–1.025)
Urobilinogen, UA: 0.2 E.U./dL
pH, UA: 6.5 (ref 5.0–8.0)

## 2021-12-27 NOTE — Progress Notes (Signed)
Patient presents today due to UTI symptoms. She states back pain. Urine dipstick preformed, showing only moderate blood . Per office protocol sent urine culture  Patient states no other concerns at this time. Sent swab for testing as well due to pelvic pain. All questions answered.

## 2021-12-31 LAB — CERVICOVAGINAL ANCILLARY ONLY
Bacterial Vaginitis (gardnerella): NEGATIVE
Candida Glabrata: NEGATIVE
Candida Vaginitis: NEGATIVE
Chlamydia: NEGATIVE
Comment: NEGATIVE
Comment: NEGATIVE
Comment: NEGATIVE
Comment: NEGATIVE
Comment: NEGATIVE
Comment: NORMAL
Neisseria Gonorrhea: NEGATIVE
Trichomonas: NEGATIVE

## 2022-01-01 ENCOUNTER — Encounter: Payer: Self-pay | Admitting: Certified Nurse Midwife

## 2022-01-01 LAB — URINE CULTURE

## 2023-07-20 ENCOUNTER — Ambulatory Visit (INDEPENDENT_AMBULATORY_CARE_PROVIDER_SITE_OTHER): Payer: Self-pay | Admitting: Student

## 2023-07-20 ENCOUNTER — Encounter: Payer: Self-pay | Admitting: Student

## 2023-07-20 VITALS — BP 118/78 | HR 79 | Ht <= 58 in | Wt 191.0 lb

## 2023-07-20 DIAGNOSIS — Z23 Encounter for immunization: Secondary | ICD-10-CM

## 2023-07-20 DIAGNOSIS — G43009 Migraine without aura, not intractable, without status migrainosus: Secondary | ICD-10-CM

## 2023-07-20 DIAGNOSIS — F5101 Primary insomnia: Secondary | ICD-10-CM

## 2023-07-20 DIAGNOSIS — Z8619 Personal history of other infectious and parasitic diseases: Secondary | ICD-10-CM | POA: Insufficient documentation

## 2023-07-20 DIAGNOSIS — H8112 Benign paroxysmal vertigo, left ear: Secondary | ICD-10-CM

## 2023-07-20 DIAGNOSIS — G43909 Migraine, unspecified, not intractable, without status migrainosus: Secondary | ICD-10-CM | POA: Insufficient documentation

## 2023-07-20 DIAGNOSIS — G4700 Insomnia, unspecified: Secondary | ICD-10-CM | POA: Insufficient documentation

## 2023-07-20 NOTE — Assessment & Plan Note (Signed)
 Tdap administered today.

## 2023-07-20 NOTE — Assessment & Plan Note (Addendum)
 Dizziness seems consistent with BPPV, HINTS exam normal today. Postitive Dix-Hallpike today. She will try home epley. Follow up if not improving.

## 2023-07-20 NOTE — Assessment & Plan Note (Signed)
 History of migraines since childhood, denies aura. Headaches are around once a month, Possible estrogen related component. Will have her try NSAID such as ibuprofen or naproxen to see if this gives her better relief.

## 2023-07-20 NOTE — Progress Notes (Signed)
 New Patient Office Visit  Subjective    Patient ID: Alexandra Thompson, female    DOB: [birth year]  Age: 33 y.o. MRN: [id number]  CC:  Chief Complaint  Patient presents with   Establish Care   Headache   Dizziness    HPI Alexandra Thompson is 33 year old person with history of obesity presents to establish care  Headaches Headache starts as a pressure that progresses to sharp pain usually at the right temple and occiput pain last 1-3 days. Has had headaches since childhood, Headaches associated with nausea. Takes tylenol 1,000 mg which helps sometimes, unable to tolerate caffeine in Excedrin as it makes her feel shaky. Resting improves the pain. Has more headaches when tired or was unable to sleep. Headaches occur once a 1 month, she thinks headaches may be related to menstrual cycle. She denies nausea, vomiting, weakness, or change in sensation.  Dizziness Feels dizziness for the last 2-3 months worse with standing and moving. Dizziness slowly improves over an hour. Notice this happens more in the morning time when she is getting out of bed. Reports she tries to drink a lot of water during the day.  Denies chest pain, dyspnea, nausea, vomiting, hearing changes, weakness, sensory changes, or syncope. No recent illness  Insomnia Reports difficulty falling asleep and remaining asleep. She has difficulty on days she works later(~10pm) to get to bed on time  and has more difficulty falling asleep. Often uses screens before bedtime. Has tried melatonin but this made her feel groggy in the morning.  Outpatient Encounter Medications as of 07/20/2023  Medication Sig   [DISCONTINUED] phentermine (ADIPEX-P) 37.5 MG tablet Take 1 tablet (37.5 mg total) by mouth daily before breakfast.   No facility-administered encounter medications on file as of 07/20/2023.    Past Medical History:  Diagnosis Date   Medical history non-contributory     Past Surgical History:  Procedure Laterality Date   CESAREAN  SECTION     CESAREAN SECTION WITH BILATERAL TUBAL LIGATION Bilateral 06/09/2016   Procedure: CESAREAN SECTION WITH BILATERAL TUBAL LIGATION;  Surgeon: Suzy Bouchard, MD;  Location: [facility] ORS;  Service: Obstetrics;  Laterality: Bilateral;  Baby Boy @ 1032 Apgars: 9/9 Weight: 9lb 15oz   CHOLECYSTECTOMY N/A 07/25/2016   Procedure: LAPAROSCOPIC CHOLECYSTECTOMY WITH INTRAOPERATIVE CHOLANGIOGRAM;  Surgeon: Ricarda Frame, MD;  Location: [facility] ORS;  Service: General;  Laterality: N/A;   ENDOSCOPIC RETROGRADE CHOLANGIOPANCREATOGRAPHY (ERCP) WITH PROPOFOL N/A 07/31/2016   Procedure: ENDOSCOPIC RETROGRADE CHOLANGIOPANCREATOGRAPHY (ERCP) WITH PROPOFOL;  Surgeon: Midge Minium, MD;  Location: [facility] ENDOSCOPY;  Service: Endoscopy;  Laterality: N/A;   ERCP N/A 10/07/2016   Procedure: ENDOSCOPIC RETROGRADE CHOLANGIOPANCREATOGRAPHY (ERCP) stent removal;  Surgeon: Midge Minium, MD;  Location: [facility] ENDOSCOPY;  Service: Endoscopy;  Laterality: N/A;   OOPHORECTOMY Left 06/09/2016   Procedure: OOPHORECTOMY;  Surgeon: Suzy Bouchard, MD;  Location: [facility] ORS;  Service: Gynecology;  Laterality: Left;   TUBAL LIGATION      Family History  Problem Relation Age of Onset   Diabetes Mother    Hyperlipidemia Father    Diabetes Father    Cancer Neg Hx     Social History   Socioeconomic History   Marital status: Married    Spouse name: Not on file   Number of children: Not on file   Years of education: Not on file   Highest education level: Not on file  Occupational History   Not on file  Tobacco Use   Smoking status: Never   Smokeless  tobacco: Never  Vaping Use   Vaping status: Never Used  Substance and Sexual Activity   Alcohol use: No   Drug use: No   Sexual activity: Yes    Birth control/protection: Surgical  Other Topics Concern   Not on file  Social History Narrative   Not on file   Social Drivers of Health   Financial Resource Strain: Not on file  Food Insecurity: Not on file   Transportation Needs: Not on file  Physical Activity: Not on file  Stress: Not on file  Social Connections: Not on file  Intimate Partner Violence: Not on file    ROS Refer to HPI    Objective   BP 118/78   Pulse 79   Ht 4\' 10"  (1.473 m)   Wt 191 lb (86.6 kg)   LMP 07/13/2023   SpO2 99%   BMI 39.92 kg/m   Physical Exam Constitutional:      Appearance: Normal appearance.  HENT:     Mouth/Throat:     Mouth: Mucous membranes are moist.     Pharynx: Oropharynx is clear.  Cardiovascular:     Rate and Rhythm: Normal rate and regular rhythm.  Pulmonary:     Effort: Pulmonary effort is normal.     Breath sounds: No rhonchi or rales.  Abdominal:     General: Abdomen is flat. Bowel sounds are normal. There is no distension.     Palpations: Abdomen is soft.     Tenderness: There is no abdominal tenderness.  Musculoskeletal:        General: Normal range of motion.     Cervical back: Normal range of motion and neck supple.     Right lower leg: No edema.     Left lower leg: No edema.  Skin:    General: Skin is warm and dry.     Capillary Refill: Capillary refill takes less than 2 seconds.  Neurological:     General: No focal deficit present.     Mental Status: She is alert and oriented to person, place, and time.     Cranial Nerves: No cranial nerve deficit, dysarthria or facial asymmetry.     Sensory: No sensory deficit.     Motor: No weakness.     Coordination: Coordination normal.     Comments: Mild nystagmus on the left with dix hallpike, no nystagmus at rest, saccade with head impulse, normal test of skew  Psychiatric:        Mood and Affect: Mood normal.        Behavior: Behavior normal.        07/20/2023   10:15 AM  Depression screen PHQ 2/9  Decreased Interest 0  Down, Depressed, Hopeless 0  PHQ - 2 Score 0  Altered sleeping 2  Tired, decreased energy 2  Change in appetite 2  Feeling bad or failure about yourself  1  Trouble concentrating 1  Moving  slowly or fidgety/restless 0  Suicidal thoughts 0  PHQ-9 Score 8  Difficult doing work/chores Not difficult at all      07/20/2023   10:15 AM  GAD 7 : Generalized Anxiety Score  Nervous, Anxious, on Edge 0  Control/stop worrying 0  Worry too much - different things 0  Trouble relaxing 0  Restless 1  Easily annoyed or irritable 0  Afraid - awful might happen 0  Total GAD 7 Score 1  Anxiety Difficulty Not difficult at all    Assessment & Plan:  Migraine without  aura and without status migrainosus, not intractable Assessment & Plan: History of migraines since childhood, denies aura. Headaches are around once a month, Possible estrogen related component. Will have her try NSAID such as ibuprofen or naproxen to see if this gives her better relief.    BPPV (benign paroxysmal positional vertigo), left Assessment & Plan: Dizziness seems consistent with BPPV, HINTS exam normal today. Postitive Dix-Hallpike today. She will try home epley. Follow up if not improving.    Primary insomnia Assessment & Plan: Discussed sleep hygiene today. Will have her try CBT-I coach to see if this helps with sleep.    Need for diphtheria-tetanus-pertussis (Tdap) vaccine Assessment & Plan: Tdap administered today  Orders: -     Tdap vaccine greater than or equal to 7yo IM  History of Helicobacter pylori infection Assessment & Plan: History of pylori in 2018, reports she completed treatment for this, will need to check for resolution. She would like to get this done at next appointment for physical     Return in about 2 months (around 09/19/2023) for physical.   Quincy Simmonds, MD

## 2023-07-20 NOTE — Assessment & Plan Note (Signed)
 History of pylori in 2018, reports she completed treatment for this, will need to check for resolution. She would like to get this done at next appointment for physical

## 2023-07-20 NOTE — Assessment & Plan Note (Signed)
 Discussed sleep hygiene today. Will have her try CBT-I coach to see if this helps with sleep.

## 2023-07-20 NOTE — Addendum Note (Signed)
 Addended by: Quincy Simmonds on: 07/20/2023 11:00 AM   Modules accepted: Level of Service

## 2023-07-20 NOTE — Patient Instructions (Addendum)
 Your dizziness is likely due to BPPV please try the home eply maneuver to help with this  Migraines You can try ibuprofen 600 mg or naproxen 500 mg as needed for headache, please try to take this as soon as you notice the headache starting.   Stomach pain Omeprazole or protonix with dinner avoid eating food 2-3 hours before sleeping, please stop taking medication for 2 weeks prior to you next appointment so we can check if the H pylori is gone.  Insomnia  CBT-I coach

## 2023-09-21 ENCOUNTER — Encounter: Payer: Self-pay | Admitting: Student

## 2023-09-21 ENCOUNTER — Encounter: Admitting: Student
